# Patient Record
Sex: Male | Born: 1955 | Race: Black or African American | Hispanic: No | Marital: Single | Smoking: Current every day smoker
Health system: Southern US, Community
[De-identification: ages and names within clinical notes are randomized; demographics above are authoritative.]

## PROBLEM LIST (undated history)

## (undated) DIAGNOSIS — R011 Cardiac murmur, unspecified: Secondary | ICD-10-CM

## (undated) DIAGNOSIS — D649 Anemia, unspecified: Secondary | ICD-10-CM

## (undated) DIAGNOSIS — J189 Pneumonia, unspecified organism: Secondary | ICD-10-CM

## (undated) DIAGNOSIS — IMO0002 Reserved for concepts with insufficient information to code with codable children: Secondary | ICD-10-CM

## (undated) DIAGNOSIS — Z8709 Personal history of other diseases of the respiratory system: Secondary | ICD-10-CM

## (undated) DIAGNOSIS — R51 Headache: Secondary | ICD-10-CM

## (undated) DIAGNOSIS — N186 End stage renal disease: Secondary | ICD-10-CM

## (undated) DIAGNOSIS — I313 Pericardial effusion (noninflammatory): Secondary | ICD-10-CM

## (undated) DIAGNOSIS — Z992 Dependence on renal dialysis: Secondary | ICD-10-CM

## (undated) DIAGNOSIS — I729 Aneurysm of unspecified site: Secondary | ICD-10-CM

## (undated) DIAGNOSIS — I10 Essential (primary) hypertension: Secondary | ICD-10-CM

## (undated) DIAGNOSIS — M329 Systemic lupus erythematosus, unspecified: Secondary | ICD-10-CM

## (undated) DIAGNOSIS — I2510 Atherosclerotic heart disease of native coronary artery without angina pectoris: Secondary | ICD-10-CM

## (undated) DIAGNOSIS — K219 Gastro-esophageal reflux disease without esophagitis: Secondary | ICD-10-CM

## (undated) HISTORY — DX: Essential (primary) hypertension: I10

## (undated) HISTORY — DX: Reserved for concepts with insufficient information to code with codable children: IMO0002

## (undated) HISTORY — PX: AV FISTULA PLACEMENT: SHX1204

## (undated) HISTORY — PX: PENILE PROSTHESIS IMPLANT: SHX240

## (undated) HISTORY — DX: End stage renal disease: N18.6

---

## 2008-01-17 ENCOUNTER — Ambulatory Visit: Payer: Self-pay | Admitting: Vascular Surgery

## 2008-01-17 ENCOUNTER — Inpatient Hospital Stay (HOSPITAL_COMMUNITY): Admission: AD | Admit: 2008-01-17 | Discharge: 2008-01-24 | Payer: Self-pay | Admitting: Nephrology

## 2008-01-18 ENCOUNTER — Encounter (INDEPENDENT_AMBULATORY_CARE_PROVIDER_SITE_OTHER): Payer: Self-pay | Admitting: Nephrology

## 2008-03-25 ENCOUNTER — Encounter: Payer: Self-pay | Admitting: Pulmonary Disease

## 2008-04-03 ENCOUNTER — Encounter: Payer: Self-pay | Admitting: Pulmonary Disease

## 2008-06-27 ENCOUNTER — Ambulatory Visit (HOSPITAL_COMMUNITY): Admission: RE | Admit: 2008-06-27 | Discharge: 2008-06-27 | Payer: Self-pay | Admitting: Nephrology

## 2008-10-02 ENCOUNTER — Ambulatory Visit: Payer: Self-pay | Admitting: Vascular Surgery

## 2008-10-21 ENCOUNTER — Ambulatory Visit (HOSPITAL_COMMUNITY): Admission: RE | Admit: 2008-10-21 | Discharge: 2008-10-21 | Payer: Self-pay | Admitting: Vascular Surgery

## 2008-10-21 ENCOUNTER — Ambulatory Visit: Payer: Self-pay | Admitting: Vascular Surgery

## 2008-10-23 ENCOUNTER — Encounter: Payer: Self-pay | Admitting: Pulmonary Disease

## 2009-01-20 ENCOUNTER — Ambulatory Visit: Payer: Self-pay | Admitting: Pulmonary Disease

## 2009-01-20 DIAGNOSIS — E785 Hyperlipidemia, unspecified: Secondary | ICD-10-CM

## 2009-01-20 DIAGNOSIS — M329 Systemic lupus erythematosus, unspecified: Secondary | ICD-10-CM

## 2009-01-20 DIAGNOSIS — IMO0002 Reserved for concepts with insufficient information to code with codable children: Secondary | ICD-10-CM

## 2009-01-20 DIAGNOSIS — J90 Pleural effusion, not elsewhere classified: Secondary | ICD-10-CM | POA: Insufficient documentation

## 2009-01-20 DIAGNOSIS — I10 Essential (primary) hypertension: Secondary | ICD-10-CM

## 2009-07-11 ENCOUNTER — Ambulatory Visit (HOSPITAL_COMMUNITY): Admission: RE | Admit: 2009-07-11 | Discharge: 2009-07-11 | Payer: Self-pay | Admitting: Nephrology

## 2009-10-16 ENCOUNTER — Encounter: Payer: Self-pay | Admitting: Cardiovascular Disease

## 2009-12-12 ENCOUNTER — Ambulatory Visit: Payer: Self-pay | Admitting: Cardiovascular Disease

## 2009-12-12 ENCOUNTER — Encounter: Payer: Self-pay | Admitting: Cardiovascular Disease

## 2009-12-12 ENCOUNTER — Telehealth (INDEPENDENT_AMBULATORY_CARE_PROVIDER_SITE_OTHER): Payer: Self-pay | Admitting: *Deleted

## 2009-12-23 ENCOUNTER — Telehealth: Payer: Self-pay | Admitting: Cardiovascular Disease

## 2009-12-25 ENCOUNTER — Telehealth: Payer: Self-pay | Admitting: Cardiovascular Disease

## 2009-12-26 ENCOUNTER — Telehealth (INDEPENDENT_AMBULATORY_CARE_PROVIDER_SITE_OTHER): Payer: Self-pay | Admitting: *Deleted

## 2009-12-31 ENCOUNTER — Ambulatory Visit: Payer: Self-pay | Admitting: Cardiovascular Disease

## 2009-12-31 ENCOUNTER — Ambulatory Visit (HOSPITAL_COMMUNITY): Admission: RE | Admit: 2009-12-31 | Discharge: 2009-12-31 | Payer: Self-pay | Admitting: Cardiovascular Disease

## 2010-01-05 ENCOUNTER — Ambulatory Visit: Payer: Self-pay | Admitting: Cardiovascular Disease

## 2010-06-09 ENCOUNTER — Emergency Department (HOSPITAL_COMMUNITY): Admission: EM | Admit: 2010-06-09 | Discharge: 2010-06-09 | Payer: Self-pay | Admitting: Emergency Medicine

## 2010-07-26 DIAGNOSIS — I729 Aneurysm of unspecified site: Secondary | ICD-10-CM

## 2010-07-26 HISTORY — DX: Aneurysm of unspecified site: I72.9

## 2010-08-25 NOTE — Progress Notes (Signed)
----   Converted from flag ---- ---- 12/12/2009 1:03 PM, Marilynne Halsted, CMA, AAMA wrote: Pt has Medicaid only.  No precert required.  ---- 12/12/2009 10:09 AM, Dessie Coma wrote: Left Hrt Cath scheduled for Tuesday May 24th with Dx: chest pain Thanks, Victorino Dike ------------------------------

## 2010-08-25 NOTE — Progress Notes (Signed)
----   Converted from flag ---- ---- 12/25/2009 4:07 PM, Marilynne Halsted, CMA, AAMA wrote: OK, pt has Medicaid, no precert required.  ---- 12/25/2009 3:37 PM, Dessie Coma wrote: cardiac cath rescheduled for Wed. 12/31/09 with Dr. Excell Seltzer at [facility]. Thanks, Victorino Dike ------------------------------

## 2010-08-25 NOTE — Progress Notes (Signed)
  Phone Note Outgoing Call   Call placed by: Dessie Coma,  December 25, 2009 3:37 PM Summary of Call: Cardiac cath rescheduled for Wed. 12/31/09 at 10:00 at [facility] with Dr. Hart Robinsons notified while at Dialysis.

## 2010-08-25 NOTE — Progress Notes (Signed)
Summary: reschedule test  Phone Note Call from Patient   Action Taken: Appt Scheduled Today Summary of Call: Pt called and wanted to re-schedule his test that he missed on May 24 in [place].  He tried to re-schedule there but they said he had to call us.  Please schedule for a Friday.  Call him on Tues or Thurs between 10-4pm to let him know appt.  [phone] Initial call taken by: Park Breed,  Dec 23, 2009 3:53 PM     Appended Document: reschedule test Cardiac cath rescheduled for 12/31/09-patient notified.

## 2010-10-06 LAB — BASIC METABOLIC PANEL
CO2: 24 mEq/L (ref 19–32)
Calcium: 8.6 mg/dL (ref 8.4–10.5)
GFR calc Af Amer: 4 mL/min — ABNORMAL LOW (ref >60)
GFR calc non Af Amer: 3 mL/min — ABNORMAL LOW (ref >60)
Glucose, Bld: 104 mg/dL — ABNORMAL HIGH (ref 70–99)
Sodium: 131 mEq/L — ABNORMAL LOW (ref 135–145)

## 2010-10-06 LAB — BASIC METABOLIC PANEL WITH GFR
BUN: 105 mg/dL — ABNORMAL HIGH (ref 6–23)
CO2: 20 meq/L (ref 19–32)
Calcium: 9 mg/dL (ref 8.4–10.5)
Chloride: 95 meq/L — ABNORMAL LOW (ref 96–112)
Creatinine, Ser: 15.84 mg/dL — ABNORMAL HIGH (ref 0.4–1.5)
GFR calc non Af Amer: 3 mL/min — ABNORMAL LOW
Glucose, Bld: 75 mg/dL (ref 70–99)
Potassium: 6.9 meq/L (ref 3.5–5.1)
Sodium: 135 meq/L (ref 135–145)

## 2010-10-06 LAB — CBC
HCT: 41.2 % (ref 39.0–52.0)
Hemoglobin: 13.3 g/dL (ref 13.0–17.0)
MCHC: 32.3 g/dL (ref 30.0–36.0)
RBC: 4.5 MIL/uL (ref 4.22–5.81)

## 2010-10-12 LAB — BASIC METABOLIC PANEL
BUN: 55 mg/dL — ABNORMAL HIGH (ref 6–23)
CO2: 27 mEq/L (ref 19–32)
Chloride: 99 mEq/L (ref 96–112)
Glucose, Bld: 77 mg/dL (ref 70–99)
Potassium: 4.9 mEq/L (ref 3.5–5.1)
Sodium: 141 mEq/L (ref 135–145)

## 2010-10-12 LAB — CBC
HCT: 38.5 % — ABNORMAL LOW (ref 39.0–52.0)
Hemoglobin: 12.4 g/dL — ABNORMAL LOW (ref 13.0–17.0)
MCHC: 32.3 g/dL (ref 30.0–36.0)
MCV: 92 fL (ref 78.0–100.0)
Platelets: 249 10*3/uL (ref 150–400)
RDW: 16.5 % — ABNORMAL HIGH (ref 11.5–15.5)

## 2010-11-05 LAB — POCT I-STAT 4, (NA,K, GLUC, HGB,HCT)
HCT: 46 % (ref 39.0–52.0)
Hemoglobin: 15.6 g/dL (ref 13.0–17.0)
Sodium: 134 mEq/L — ABNORMAL LOW (ref 135–145)

## 2010-11-19 ENCOUNTER — Ambulatory Visit (INDEPENDENT_AMBULATORY_CARE_PROVIDER_SITE_OTHER): Payer: Medicaid Other

## 2010-11-19 DIAGNOSIS — N186 End stage renal disease: Secondary | ICD-10-CM

## 2010-11-19 DIAGNOSIS — T82898A Other specified complication of vascular prosthetic devices, implants and grafts, initial encounter: Secondary | ICD-10-CM

## 2010-11-19 NOTE — H&P (Signed)
HISTORY AND PHYSICAL EXAMINATION  November 19, 2010  Re:  Joel [name], Joel [name]                 DOB:  05/19/1956  CHIEF COMPLAINT:  Large pseudoaneurysm and left upper arm AV fistula.  The patient is a 55 year old gentleman with a history of end-stage renal disease on hemodialysis Tuesday, Thursday and Saturday, diabetes, hypertension who had a left brachiocephalic AV fistula placed by Dr. Edilia Bo in March of 2010.  The fistula had been working extremely well until last Thursday when the proximal portion of the fistula was accessed and post this hemodialysis session he developed a large pseudoaneurysm, which he states has come down somewhat in size. However, the area over the puncture site has become thin.  It was felt that the patient should have resection of this pseudoaneurysm.  The patient denies any signs of steal in the left upper extremity.  PAST MEDICAL HISTORY:  Significant for end-stage renal disease, diabetes, hypertension.  He has no known drug allergies.  MEDICATIONS:  Are amlodipine, lisinopril, Tums, Ultra Renal Vitamins, and Zocor.  SOCIAL HISTORY/FAMILY HISTORY:  Are unchanged from previous hospitalizations.  REVIEW OF SYSTEMS:  Is unchanged except for the development of pseudoaneurysm in the left upper arm AV fistula.  PHYSICAL EXAM:  Is a well-developed, well-nourished gentleman in no acute distress.  He is alert and oriented x3.  HEENT is grossly within normal limits.  His heart rate is 90.  His sats are 100 and his respiratory rate is 12.  His lungs are clear.  His heart rate and rhythm is regular.  Bilateral upper extremities are warm.  He has positive radial pulses.  He has a positive left pseudoaneurysm in the proximal portion of the AV fistula.  Brachiocephalic in the left upper extremity.  ASSESSMENT/PLAN:  Assessment is a new enlarged pseudoaneurysm in the left proximal portion of the AV fistula.  Dr. Darrick Penna was in to see the patient.  He  felt that the patient should have this area resected as there is a very thin layer of skin over the pseudoaneurysm.  It was felt that the patient needed this resection secondary to increased risk of bleeding.  Plan is to have Dr. Imogene Burn do a revision/resection of the pseudoaneurysm with revision of left brachiocephalic AV fistula on Monday 16/04/9603.  Della Goo, PA-C  Charles E. Fields, MD Electronically Signed  RR/MEDQ  D:  11/19/2010  T:  11/19/2010  Job:  [id number]

## 2010-11-23 ENCOUNTER — Ambulatory Visit (HOSPITAL_COMMUNITY)
Admission: RE | Admit: 2010-11-23 | Discharge: 2010-11-23 | Disposition: A | Discharge status: Home / Self Care | Payer: Medicaid Other | Source: Ambulatory Visit | Attending: Vascular Surgery | Admitting: Vascular Surgery

## 2010-11-23 DIAGNOSIS — Z01812 Encounter for preprocedural laboratory examination: Secondary | ICD-10-CM | POA: Insufficient documentation

## 2010-11-23 DIAGNOSIS — Z538 Procedure and treatment not carried out for other reasons: Secondary | ICD-10-CM | POA: Insufficient documentation

## 2010-11-23 LAB — POCT I-STAT 4, (NA,K, GLUC, HGB,HCT)
Glucose, Bld: 88 mg/dL (ref 70–99)
Potassium: 4.8 mEq/L (ref 3.5–5.1)
Sodium: 136 mEq/L (ref 135–145)

## 2010-11-23 LAB — SURGICAL PCR SCREEN: MRSA, PCR: NEGATIVE

## 2010-11-25 ENCOUNTER — Ambulatory Visit (HOSPITAL_COMMUNITY): Payer: Medicaid Other

## 2010-11-25 ENCOUNTER — Ambulatory Visit (HOSPITAL_COMMUNITY)
Admission: RE | Admit: 2010-11-25 | Discharge: 2010-11-25 | Disposition: A | Discharge status: Home / Self Care | Payer: Medicaid Other | Source: Ambulatory Visit | Attending: Vascular Surgery | Admitting: Vascular Surgery

## 2010-11-25 DIAGNOSIS — I2510 Atherosclerotic heart disease of native coronary artery without angina pectoris: Secondary | ICD-10-CM | POA: Insufficient documentation

## 2010-11-25 DIAGNOSIS — Y832 Surgical operation with anastomosis, bypass or graft as the cause of abnormal reaction of the patient, or of later complication, without mention of misadventure at the time of the procedure: Secondary | ICD-10-CM | POA: Insufficient documentation

## 2010-11-25 DIAGNOSIS — T82898A Other specified complication of vascular prosthetic devices, implants and grafts, initial encounter: Secondary | ICD-10-CM | POA: Insufficient documentation

## 2010-11-25 DIAGNOSIS — M329 Systemic lupus erythematosus, unspecified: Secondary | ICD-10-CM | POA: Insufficient documentation

## 2010-11-25 DIAGNOSIS — N186 End stage renal disease: Secondary | ICD-10-CM | POA: Insufficient documentation

## 2010-11-25 DIAGNOSIS — Z992 Dependence on renal dialysis: Secondary | ICD-10-CM | POA: Insufficient documentation

## 2010-11-25 DIAGNOSIS — I721 Aneurysm of artery of upper extremity: Secondary | ICD-10-CM | POA: Insufficient documentation

## 2010-11-25 DIAGNOSIS — F17200 Nicotine dependence, unspecified, uncomplicated: Secondary | ICD-10-CM | POA: Insufficient documentation

## 2010-11-25 DIAGNOSIS — Z01818 Encounter for other preprocedural examination: Secondary | ICD-10-CM | POA: Insufficient documentation

## 2010-11-25 DIAGNOSIS — I120 Hypertensive chronic kidney disease with stage 5 chronic kidney disease or end stage renal disease: Secondary | ICD-10-CM

## 2010-11-25 LAB — POCT I-STAT 4, (NA,K, GLUC, HGB,HCT)
Glucose, Bld: 83 mg/dL (ref 70–99)
HCT: 46 % (ref 39.0–52.0)
Hemoglobin: 15.6 g/dL (ref 13.0–17.0)

## 2010-11-30 NOTE — Op Note (Signed)
Joel [name], Joel [name]               ACCOUNT NO.:  [id number]  MEDICAL RECORD NO.:  [id number]           PATIENT TYPE:  O  LOCATION:  SDSC                         FACILITY:  [facility]  PHYSICIAN:  Dejia Ebron E. Kentravious Lipford, MD  DATE OF BIRTH:  [birth year]  DATE OF PROCEDURE:  11/25/2010 DATE OF DISCHARGE:  11/25/2010                              OPERATIVE REPORT   PROCEDURE:  Revision of left upper arm arteriovenous fistula with 7 mm interposition PTFE graft.  PREOPERATIVE DIAGNOSIS:  Pseudoaneurysm, left upper arm arteriovenous fistula.  POSTOPERATIVE DIAGNOSIS:  Pseudoaneurysm, left upper arm arteriovenous fistula.  ANESTHESIA:  General.  ASSISTANT:  Pecola Leisure, PA-C  OPERATIVE FINDINGS:  A 7-mm interposition graft replacing proximal half of left brachiocephalic AV fistula.  OPERATIVE DETAILS:  After obtaining informed consent, the patient was taken to the operating room.  The patient was placed in supine position on the operating table.  After induction of general anesthesia, the patient's entire left upper extremity was prepped and draped in usual sterile fashion.  Next, a transverse incision was made just above the level of the anastomosis near the antecubital crease.  Incision was carried down through the subcutaneous tissues down the level of the fistula.  The fistula was approximately 8 mm in diameter.  This was dissected free circumferentially.  Vessel loop was placed around this. An additional transverse incision was then made approximately 7 cm more proximal on the arm from this above the area of aneurysmal degeneration. Incision was carried down through the subcutaneous tissues down to level of the fistula, which was of similar diameter.  This was dissected free circumferentially and a vessel loop placed around this.  Next, a subcutaneous tunnel was created and a 7-mm interposition graft was brought through the subcutaneous tunnel lateral to the old fistula.   The patient was then given 5000 units of intravenous heparin.  Fistula was clamped proximally just above the anastomosis and the vein transected at this level.  There was no backbleeding from the vein above after clamping distally.  The 7-mm interposition graft was sewn end-to-end to the proximal fistula using a running 6-0 Prolene suture.  Just prior to the completion of the anastomosis, it was fore bled, back bled, and thoroughly flushed.  Anastomosis was secured.  Clamps were released. There was good pulsatile flow in the graft immediately.  Proximal anastomosis was then packed with thrombin and Gelfoam.  Attention was then turned to the distal limb of the graft.  This was cut to length and anastomosed end-to-end to the distal portion of the fistula using running 6-0 Prolene suture.  Just prior to the completion of anastomosis, it was fore bled, back bled, and thoroughly flushed. Anastomosis was secured.  Clamps were released.  There was good pulsatile flow through the graft immediately.  Hemostasis was obtained with administration of 50 mg of protamine as well as thrombin and Gelfoam.  The old portion of the fistula was fully decompressed from the pseudoaneurysm.  There was no bleeding from the vein on the proximal or distal end.  At this point, everything was thoroughly irrigated with normal saline solution.  Subcutaneous  tissues of both incisions were closed with a running 3-0 Vicryl suture.  Skin of both incisions was closed with a 4-0 Vicryl subcuticular stitch.  The patient tolerated the procedure well and there were no complications.  Instrument, sponge, and needle counts were correct at the end of the case.  The patient was taken to the recovery room in stable condition.     Janetta Hora. Iran Kievit, MD     CEF/MEDQ  D:  11/25/2010  T:  11/26/2010  Job:  [id number]  Electronically Signed by Fabienne Bruns MD on 11/30/2010 07:58:58 AM

## 2010-12-08 NOTE — Assessment & Plan Note (Signed)
[facility]                        [place] CARDIOLOGY OFFICE NOTE   CANYON, LOHR                        MRN:          [id number]  DATE:01/05/2010                            DOB:          03-Jul-1956    PROBLEMS LIST:  1. End-stage renal disease secondary to hypertension.  2. Hypertension.  3. Tobacco use.  4. Secondary hyperparathyroidism.  5. Degenerative disk disease.   INTERVAL HISTORY:  Since the last visit, the patient underwent a left  heart catheterization at [facility] which showed a 30% stenosis in the  proximal LAD, 28-30% stenosis in the left circumflex, and 28-30%  stenosis in the right coronary artery.  The mid right coronary artery  had 50% irregular stenosis that had a peak FFR of 0.92-0.94.  His left  ventricular systolic function was normal.  The patient states that he  has been getting along quite well.  He has been exercising several times  a week and has had no difficulty in doing so.  He avoids fatty foods and  fried foods.   PHYSICAL EXAMINATION:  VITAL SIGNS:  Blood pressure 118/64, pulse 82,  saturating 97% on room air, and he weighs 189 pounds.  GENERAL:  No acute distress.  HEENT:  Normocephalic, atraumatic.  NECK:  Supple.  HEART:  Regular rate and rhythm without murmur, rub, or gallop.  LUNGS:  Clear bilaterally.  ABDOMEN:  Soft, nontender, nondistended.  EXTREMITIES:  Without edema.  Right groin has 2+ femoral pulse and no  evidence of hematoma.   Review of the patient's cholesterol profile, dated Dec 12, 2009, total  cholesterol 200, HDL 87, triglyceride 84, LDL 97.   ASSESSMENT AND PLAN:  The patient is doing well from a cardiovascular  standpoint.  He is not having any symptoms of angina and his heart  catheterization does not show obstructive coronary disease.  His blood  pressure is also very well controlled with diet, exercise, and  lisinopril.  His cholesterol profile is excellent.  At this time, I  will  not initiate statin therapy; however, this could be considered in the  future.  Discussion was held regarding tobacco  cessation.  The patient is making efforts in this direction.  He will  follow up with his primary care physician and we will see him back in 1  year's time unless problem were to arise in the interim.     Brayton El, MD  Electronically Signed    SGA/MedQ  DD: 01/05/2010  DT: 01/06/2010  Job #: [id number]   cc:   Marina Gravel, MD  Garnetta Buddy, M.D.

## 2010-12-08 NOTE — H&P (Signed)
Joel [name], Joel [name] NO.:  [id number]   MEDICAL RECORD NO.:  [id number]          PATIENT TYPE:  INP   LOCATION:  6743                         FACILITY:  [facility]   PHYSICIAN:  Garnetta Buddy, M.D.   DATE OF BIRTH:  11-14-1955   DATE OF ADMISSION:  01/17/2008  DATE OF DISCHARGE:                              HISTORY & PHYSICAL   REASON FOR ADMISSION:  Elevated serum creatinine and congestive heart  failure.   HISTORY OF PRESENTING ILLNESS:  This is a 55 year old African American  male who has a history of hypertension and was evaluated on October 23, 2007, for hypertensive nephrosclerosis.  Serum creatinine at that time  was 6.0.  His GFR was less than 10 mL per minute.  It was recommended  that he be initiated on dialysis and an appointment was made for AV  fistula placement as well as kidney options classes.  Joel [name] did not  comply with this appointments and presented to the emergency room at  [facility] on January 16, 2008, with a serum creatinine of 15.  Prior serological workup with hepatitis B, C, HIV, SPEP, and UPEP was  unremarkable.   His presenting illness included,  1. Dyspnea on exertion.  2. Increasing ankle swelling.  3. Increasing weight of about 30 pounds in the past month.   PAST MEDICAL HISTORY:  1. History of back injury in 2002.  2. History of hypertension.  3. No history of chronic kidney disease, stage V.  4. History of hyperlipidemia, noncompliant with his antilipid agents.   MEDICATIONS:  The patient is no longer on current medications.  His  outpatient medications should include,  1. Amlodipine 5 mg nightly.  2. Atenolol 50 mg daily.  3. Simvastatin 40 mg daily.   ALLERGIES:  No known drug allergies.   REVIEW OF SYSTEMS:  GENERAL:  He denies fatigue, fevers, sweats, or  chills.  EYES:  No visual complaints, blurred vision, or double vision.  EARS, NOSE, MOUTH, AND THROAT:  No hearing loss or epistaxis.  No sore  throat.   Left has dentures.  CARDIOVASCULAR:  No anginal chest pain,  orthopnea, PND, palpitations (pulse); admits to some lower extremity  swelling and dyspnea on exertion.  RESPIRATORY:  He denies cough,  wheeze, or hemoptysis.  ABDOMEN:  No abdominal pain, nausea, vomiting,  or change in bowel habit.  No loss of appetite.  No metallic taste in  mouth.  UROGENITAL:  He denies urgency, frequency, dysuria, or nocturia.  No gross hematuria.  History of bilateral renal calculi on renal  ultrasound.  NEUROLOGIC:  He denies strokes, seizures, numbness,  tingling, dizziness, or weakness.  No dysarthria, dysphagia, or  dysphonia.  MUSCULOSKELETAL:  He denies joint pain or muscle pain.  No  use of nonsteroidal anti-inflammatories.  DERMATOLOGIC:  No skin rashes,  but admits to itching.   SOCIAL HISTORY:  Married.  He does not smoke cigarettes.  He does not  use alcohol.   FAMILY HISTORY:  Negative for end-stage renal disease.   PHYSICAL EXAMINATION:  GENERAL:  Alert, very pleasant gentleman  in no  obvious distress, resting comfortably with 2 L of oxygen via nasal  cannula.  A well-developed, well-nourished [place] American male.  VITAL SIGNS:  Blood pressure 140/90, pulse is 70, and weight is pending.  HEAD AND EYES:  Normocephalic and atraumatic.  Pupils are round, equal,  and reactive.  Grade 2 hypertensive changes.  Funduscopic evaluation has  been benign.  EARS, NOSE, MOUTH, AND THROAT:  Tympanic membranes were normal.  Nasal  mucosa is clear.  Oropharynx is clear.  NECK:  Supple with no thyromegaly and no adenopathy.  No JVP.  Supple.  No carotid bruits.  CARDIOVASCULAR:  No heaves, thrills, rubs, or gallops.  Nondisplaced  apex beat.  Regular rate and rhythm with no murmurs.  RESPIRATORY:  Lung fields are clear to auscultation.  No wheezes or  rales, question of resonance throughout.  ABDOMEN:  Soft and nontender.  Bowel sounds active.  No  organosplenomegaly.  EXTREMITIES:  No cyanosis or  clubbing.  Trace pitting edema.  __________  lateral malleolus.   LABORATORY DATA:  Most recent labs:  WBC 9.5, hemoglobin 7.1, and  platelets of 453.  Albumin 1.9, sodium 138, potassium 3.5, CO2 of 14,  BUN 87, creatinine 15.4, glucose 88, ALT 15, AST 13, alkaline  phosphatase 74, bilirubin less than 0.1, and INR 1.  CPK is negative.   ASSESSMENT AND PLAN:  1. End-stage renal disease, will need initiation on hemodialysis, will      need access placement which includes arteriovenous graft, fistula      as well as Diatek catheter.  2. Anemia.  We will establish posttransfusion 2 units of red blood      cells.  3. Bones.  Continue binders, vitamin D.  We will check PTH.  4. Hypertension, appears adequately controlled.  5. Volume overload.  We will place the patient on Lasix 160 mg q.8 h.  6. Hyperlipidemia.  We will give the patient simvastatin 40 mg daily.      We will check lipid panel.      Garnetta Buddy, M.D.  Electronically Signed     MWW/MEDQ  D:  01/17/2008  T:  01/18/2008  Job:  [id number]

## 2010-12-08 NOTE — Op Note (Signed)
Joel [name], Joel [name]               ACCOUNT NO.:  [id number]   MEDICAL RECORD NO.:  [id number]          PATIENT TYPE:  INP   LOCATION:  6743                         FACILITY:  [facility]   PHYSICIAN:  Di Kindle. Edilia Bo, M.D.DATE OF BIRTH:  1955/11/23   DATE OF PROCEDURE:  01/18/2008  DATE OF DISCHARGE:  01/24/2008                               OPERATIVE REPORT   PREOPERATIVE DIAGNOSIS:  End-stage renal disease   POSTOPERATIVE DIAGNOSIS:  End-stage renal disease.   PROCEDURES:  1. Ultrasound-guided placement of right internal jugular Diatek      catheter.  2. Placement of a right upper arm arteriovenous fistula.   SURGEON:  Di Kindle. Edilia Bo, MD   ASSISTANT:  Nurse.   ANESTHESIA:  Local with sedation.   TECHNIQUE:  The patient was taken to the operating room and sedated by  Anesthesia.  The neck and upper chest were prepped and draped in the  usual sterile fashion.  After the skin was anesthetized with 1%  lidocaine and under ultrasound guidance, the right IJ was cannulated and  a guidewire was introduced into the superior vena cava under  fluoroscopic control.  The tract over the wire was dilated and then the  dilator and peel-away sheath were passed over the wire and then wire and  dilator removed.  The catheter was passed through the peel-away sheath  and positioned in the right atrium.  The exit site for the catheter was  selected and the skin anesthetized between the 2 areas.  The catheter  was then brought through the tunnel, cut to the appropriate length, and  the distal ports were attached.  Both ports withdrew easily.  We then  flushed with heparinized saline and filled with concentrated heparin.  The catheter was secured at its exit site with a 3-0 nylon suture.  The  IJ cannulation site was closed with 4-0 subcuticular stitch.  Sterile  dressing was applied.   Next, attention was turned to placement of a right upper arm AV fistula.  The right upper extremity  was prepped and draped in usual sterile  fashion.  After the skin was infiltrated with 1% lidocaine, a transverse  incision was made above the antecubital level.  Here the cephalic vein  was dissected free and was felt to be of reasonable size.  It was  ligated distally and irrigated up nicely with heparinized saline.  Brachial artery was dissected free beneath the fascia.  The patient was  then heparinized.  The brachial artery was clamped proximally and  distally and a longitudinal arteriotomy was made.  The vein was  mobilized over and sewn end-to-side to the artery using continuous 6-0  Prolene suture.  At the completion, there was a good thrill in the  fistula.  Hemostasis was obtained in the wound and  the wound was closed with deep layer of 3-0 Vicryl and the skin closed  with 4-0 Vicryl.  Sterile dressing was applied.  The patient tolerated  the procedure well and was transferred to the recovery room in  satisfactory condition.  All needle and sponge counts were correct.  Di Kindle. Edilia Bo, M.D.  Electronically Signed     CSD/MEDQ  D:  04/11/2008  T:  04/12/2008  Job:  [id number]

## 2010-12-08 NOTE — Assessment & Plan Note (Signed)
[facility]                        [place] CARDIOLOGY OFFICE NOTE   Joel, [name]                        MRN:          [id number]  DATE:12/12/2009                            DOB:          [birth year]    CHIEF COMPLAINT:  Chest pain.   HISTORY OF PRESENT ILLNESS:  The patient is a 55 year old black male  with past medical history significant for end-stage renal disease  secondary to hypertension on hemodialysis Tuesday, Thursday, Saturday,  hypertension, long-term tobacco use, who is presenting with the chest  discomfort and shortness of breath on exertion.  The patient states that  over the past several months, he has had several episodes of chest  discomfort and shortness of breath every time he has exerted himself  past a moderate degree.  He states he is mainly sedentary.  The chest  discomfort feels like an aching.  It does not radiate, but it is  associated with shortness of breath.  He otherwise has been in his  normal state of health and he states compliance with all of his  medications.  He has never had a cardiac workup in the past.   PAST MEDICAL HISTORY:  As above in HPI.  In addition, the patient has  degenerative disk disease, secondary hyperparathyroidism.   SOCIAL HISTORY:  Half pack a day for approximately 25 years, no alcohol.  No stimulant drug use.   FAMILY HISTORY:  Negative for premature coronary artery disease.   ALLERGIES:  No known drug allergies.   MEDICATIONS:  1. Lisinopril 40 mg daily.  2. PhosLo Nephro-Vite.   REVIEW OF SYSTEMS:  As in HPI.  In addition, the patient endorses lower  back pain.  Other systems as in HPI are otherwise negative.   PHYSICAL EXAMINATION:  VITAL SIGNS:  Blood pressure 120/76, heart rate  78, satting 97% on room air, and he weighs 180 pounds.  GENERAL:  No acute distress.  HEENT:  Normocephalic, atraumatic.  NECK:  Supple.  HEART:  Regular rate and rhythm without murmur, rub, or  gallop.  LUNGS:  Clear bilaterally.  ABDOMEN:  Soft, nontender, nondistended.  EXTREMITIES:  Without edema.  SKIN:  Warm and dry.  PSYCHIATRIC:  The patient is appropriate.  NEUROLOGIC:  Nonfocal.  MUSCULOSKELETAL:  Bilateral upper and lower extremity strength 5/5.   Review of the patient's EKG taken today in clinic normal sinus rhythm,  left anterior fascicular block, Q-waves in leads V1 through V3 with  approximately 1-2 mm of ST-segment elevation in leads V1 through V4.  There are nonspecific T-wave changes elsewhere compared with EKG dated  March 07, 2009.  There has been no significant change.  Review of lab  work from March indicates a BMP within normal limits with exception of a  potassium 5.2, creatinine 11.7.  His white count is 7.  His hemoglobin  is 11, hematocrit 35.  No platelet count was reported.   ASSESSMENT:  A 55 year old black male with multiple risk factors for  coronary artery disease presenting with chest discomfort and shortness  of breath with exertion.  He also has a EKG  indicating probable prior  myocardial infarction dating back to at least August 2010.  The patient  also is a ongoing tobacco user and today he expresses interest in  tobacco cessation.   PLAN:  We will have the patient institute therapy with aspirin 81 mg  daily.  We will set him up for a left heart catheterization next week to  define his coronary anatomy and rule out obstructive coronary artery  disease.  We will see him back in clinic after the heart  catheterization.  At that time, we will discuss possible interventions  for tobacco cessation.  Today, we will check a CMP, CBC, troponin, and  fasting lipids.  The heart catheterization was performed in the morning  of his dialysis.  The patient is aware of the risks and benefits of the  procedure and he agrees to proceed.  He states that he is now completely  compliant with his medications and would have no issues taking long-term   antiplatelet agents.     Brayton El, MD  Electronically Signed    SGA/MedQ  DD: 12/12/2009  DT: 12/13/2009  Job #: [id number]

## 2010-12-08 NOTE — Op Note (Signed)
Joel [name], Joel [name]               ACCOUNT NO.:  [id number]   MEDICAL RECORD NO.:  [id number]          PATIENT TYPE:  AMB   LOCATION:  SDS                          FACILITY:  [facility]   PHYSICIAN:  Di Kindle. Edilia Bo, M.D.DATE OF BIRTH:  14-Feb-1956   DATE OF PROCEDURE:  10/21/2008  DATE OF DISCHARGE:  10/21/2008                               OPERATIVE REPORT   PREOPERATIVE DIAGNOSIS:  Stage V chronic kidney disease.   POSTOPERATIVE DIAGNOSIS:  Stage V chronic kidney disease.   PROCEDURE:  Placement of a new left upper arm arteriovenous fistula.   SURGEON:  Di Kindle. Edilia Bo, MD   ASSISTANT:  Jerold Coombe, PA   ANESTHESIA:  Local with sedation.   TECHNIQUE:  The patient was taken to the operating room, sedated by  Anesthesia.  The left upper extremity was prepped and draped in the  usual sterile fashion.  After the skin was infiltrated with 1%  lidocaine, a transverse incision was made just above the antecubital  level.  The cephalic vein was dissected free, was ligated distally, and  irrigated up nicely with heparinized saline.  The brachial artery was  dissected free beneath the fascia.  The patient was heparinized.  The  brachial artery was clamped proximally and distally and a longitudinal  arteriotomy was made.  The vein was mobilized over and sent end-to-side  to the artery using continuous 6-0 Prolene suture.  At the completion,  there was an excellent thrill in the fistula and a palpable radial  pulse.  Hemostasis was obtained of the wound.  The wound was closed the  deep layer with 3-0 Vicryl, subcutaneous layer with 4-0 Vicryl.  Sterile  dressing was applied.  The patient tolerated the procedure well and was  transferred to the recovery room in satisfactory condition.  All needle  and sponge counts were correct.      Di Kindle. Edilia Bo, M.D.  Electronically Signed     CSD/MEDQ  D:  10/21/2008  T:  10/22/2008  Job:  [id number]   cc:   [facility]

## 2010-12-08 NOTE — Assessment & Plan Note (Signed)
OFFICE VISIT   Joel [name], Joel [name]  DOB:  January 06, 1956                                       10/02/2008  ZOXWR#:[id number]   The patient is a 55 year old male with end-stage renal disease who  currently dialyzes on Tuesdays, Thursdays and Saturdays at [facility].  He is referred by Dr. Hyman Hopes for placement of hemodialysis  access.  He is left-handed.  He currently is dialyzing via a right  brachiocephalic AV fistula which was placed some while back.  Apparently  the flow rate on dialysis has been diminished and recent shuntogram  shows degeneration of the right upper arm AV fistula and there is  concern that it may have eminent failure.  He is referred today for  placement of a new hemodialysis access.   His medications include amlodipine, fosinopril, Tums Ultra, renal  vitamin and Zocor.   He has no known drug allergies.   Past medical history is also significant for hypertension, iron  deficiency anemia, dyslipidemia, secondary hyperparathyroidism, medical  noncompliance, left pleural effusion.   On physical exam blood pressure is 127/78 in the left arm, pulse is 92  and regular.  Right upper extremity shows a brachiocephalic AV fistula  with a thrill proximally.  There is evidence of degeneration of the  central portion of the fistula and the flow is not as easily palpable in  this area.  Left upper extremity shows diminished left and right radial  pulse, left brachial pulse is 2+.   He had a vein mapping ultrasound today which showed the cephalic vein in  the left upper extremity is between 31-40 mm in the forearm and 33-53 mm  in the upper arm.   Since the patient's right upper arm fistula is failing he will need to  have a new access placed.  Hopefully a new access will be ready by the  time the right arm access fails.  I believe the best option for him  would be placement of a left brachiocephalic AV fistula since the left  radial pulse  is fairly diminished.  I discussed with him today the  risks, benefits, possible complications and procedure details including  but not limited to nonmaturation of the fistula 10-15%, bleeding,  infection.  He understands and agrees to proceed.  He also understands  there is a small risk of developing ischemic steal.  Fistula placement  is scheduled for 10/21/2008 and this was at the patient's request.   Janetta Hora. Fields, MD  Electronically Signed   CEF/MEDQ  D:  10/02/2008  T:  10/03/2008  Job:  [id number]   cc:   Garnetta Buddy, M.D.

## 2010-12-08 NOTE — Procedures (Signed)
CEPHALIC VEIN MAPPING   INDICATION:  End-stage renal disease.   HISTORY:  End-stage renal disease.   EXAM:   The right cephalic vein is not evaluated.   The left cephalic vein is compressible.   Diameter measurements range from 0.31 to 0.33.   See attached worksheet for all measurements.   IMPRESSION:  Patent left cephalic vein which is of acceptable diameter  for use as a dialysis access site.   ___________________________________________  Janetta Hora. Fields, MD   MG/MEDQ  D:  10/02/2008  T:  10/02/2008  Job:  [id number]

## 2010-12-08 NOTE — Letter (Signed)
Dec 12, 2009    Joel [name]. Caryn Section, M.D.  [facility]  [address]  [place], [place]  [address]   RE:  Joel [name], Joel [name]  MRN:  [id number]  /  DOB:  21-Mar-1956   Dear Dr. Caryn Section:   I had the pleasure of seeing Joel [name] in clinic this morning.  As you  know, he is a 55 year old gentleman with end-stage renal disease from  hypertension who has been having chest discomfort and shortness of  breath with exertion.  His EKG indicates a prior myocardial infarction  in the anterior septal distribution.  This EKG is unchanged from August  2010.  We will proceed with a left heart catheterization and possible  percutaneous intervention as the patient's symptoms are concerning for  obstructive coronary disease.  This will be arranged on a day prior to  hemodialysis.  I have asked him to institute therapy with aspirin 81 mg  daily.   Thank you for the referral of this patient and I look forward to  following him along with you.    Sincerely,      Brayton El, MD  Electronically Signed    SGA/MedQ  DD: 12/12/2009  DT: 12/12/2009  Job #: [id number]

## 2010-12-11 NOTE — Discharge Summary (Signed)
NAMECAMILA, NORVILLE NO.:  [id number]   MEDICAL RECORD NO.:  [id number]          PATIENT TYPE:  INP   LOCATION:  6743                         FACILITY:  [facility]   PHYSICIAN:  Garnetta Buddy, M.D.   DATE OF BIRTH:  [birth year]   DATE OF ADMISSION:  01/17/2008  DATE OF DISCHARGE:  01/24/2008                               DISCHARGE SUMMARY   DISCHARGE DIAGNOSES:  1. New end-stage renal disease with congestive heart failure and      uremia on admission.  2. Hypertension.  3. Anemia.  4. Secondary hypoparathyroidism.  5. Left upper lobe pneumonia/improved.   PROCEDURES:  1. On January 18, 2008, placement of right internal jugular permanent      catheter and right AVF by Dr. Waverly Ferrari.  2. On January 18, 2005, CT angiography of the chest.  Impression, dense      left upper lobe pneumonia with associated reactive adenopathy.  No      pulmonary emboli.  Small bilateral pleural effusions and small      pericardial effusion.  Mild bilateral lower lobe atelectasis.   PROCEDURE:  Hemodialysis.   CONSULTATIONS:  Di Kindle. Edilia Bo, MD   HISTORY OF PRESENT ILLNESS:  Mr. Gatt is a 55 year old African  American gentleman with a past medical history significant for  hypertension that was a recently evaluated on October 23, 2007, for  hypertensive nephrosclerosis by Dr. Elvis Coil.  Serum creatinine at  that time was 6.  GFR was less than 10 mL/min.  It was recommended that  the patient be initiated on hemodialysis.  An appointment was made for  an AV fistula as well as kidney option class.  The patient did not  comply with his appointments and presented to the emergency room at  [facility] on January 16, 2008, with a serum creatinine of 15.  Prior serological workup with hepatitis B and C, HIV, SPEP and UPEP were  unremarkable.   ADMISSION LABORATORY DATA:  WBC is 9.5, hemoglobin 7.1, and platelets  453.  Albumin 1.9, sodium 138, potassium 3.5, CO2 of 14, BUN  87,  creatinine 15.4, and glucose 88.  CPK negative.   HOSPITAL COURSE:  1. New end-stage renal disease with congestive heart failure and      uremia on admission.  The patient was admitted for initiation of      hemodialysis.  The patient was provided appropriate education and      video on hemodialysis, because he did not attend the Kidney options      class.  We consulted the [facility] for access to be placed to include a      right internal jugular and right AVF, which was placed on January 18, 2008, after appropriate vein mapping.  The patient initiated      hemodialysis on January 18, 2008, with a successful treatment and a      net UF of 2000.  He then dialyzed again on January 19, 2008; January 20, 2008; January 22, 2008; January 23, 2008, and prior  to discharge.  The      patient handled hemodialysis well.  We were able to UF      approximately 12 L during this admission.  The patient was accepted      at the [facility] in which he will begin once he is      discharged.  The patient's uremia symptoms did resolve prior to      discharge.  2. Hypertension.  The patient came to the hospital with no medication.      He had not been on medications in quite some time.  Initially,      Lasix was started at 160 mg every 8 hours until hemodialysis could      be initiated.  Lasix was then discontinued and amlodipine was      initiated, and blood pressures ranged in the 150-160 systolic.      Amlodipine was decreased to rule out for appropriate      ultrafiltration on hemodialysis.  Throughout this admission, the      patient's blood pressures did improve to a more normotensive range.  3. Anemia.  This was felt as a chronic disease secondary to the      patient's underlying renal insufficiency.  The patient was began on      Aranesp per protocol.  Iron studies were also performed with iron      stores being less than 10, therefore IV iron was initiated      appropriately this admission and  will be continued at the      outpatient dialysis center.  4. Secondary hyperparathyroidism.  Of note, the patient's parathyroid      harmone was 247.3, calcium 6.7 and phosphorus at 5.3, phosphate      binders as well as vitamin D was initiated during this      hospitalization, and calcium and phosphorus were monitored      respectively.  5. Left upper lobe pneumonia, improved.  On January 19, 2008, the patient      began experiencing pleuritic chest pain and noted to be febrile      with a T-max of 101.  CT angio was performed because of the      pleuritic chest pain and ruled out for pulmonary emboli, but it was      positive for left upper lobe pneumonia.  The patient was initially      placed on vancomycin empirically for fevers, but once CT positive      for pneumonia converted to Avelox.  The patient was afebrile, on      the date of discharge, feeling better.  H1N1 was negative this      admission.  Of note, the patient was placed on respiratory droplet      precautions in anticipation of H1N2.   DISCHARGE MEDICATIONS:  1. Zocor 40 mg daily.  2. Nephro-Vite daily.  3. Tums Ultra 3 tablets with all meals.  4. Norvasc 5 mg at bedtime.  5. Avelox 400 p.o. daily, times an additional 6 days.   DISCHARGE DIAGNOSES:  The patient is restricted to 40 ounces of fluid  per day.  He will be instructed on a renal diet from the dietician at  the [facility]; however, he is to monitor his salt and  potassium intake.  He will initiate hemodialysis at the [facility] on Thursday at 10:30.  He will increase his activity slowly.  He  will avoid any  showers while the permanent catheter is in.      Azucena Fallen, [place]      Garnetta Buddy, M.D.  Electronically Signed    MY/MEDQ  D:  03/04/2008  T:  03/05/2008  Job:  [id number]   cc:   [facility]  [address]

## 2010-12-24 ENCOUNTER — Encounter: Payer: Self-pay | Admitting: Cardiovascular Disease

## 2010-12-28 ENCOUNTER — Ambulatory Visit: Payer: Self-pay | Admitting: Cardiovascular Disease

## 2011-04-22 LAB — HEMOGLOBIN A1C
Hgb A1c MFr Bld: 5.1
Mean Plasma Glucose: 104

## 2011-04-22 LAB — CBC
HCT: 23.3 — ABNORMAL LOW
HCT: 28.9 — ABNORMAL LOW
Hemoglobin: 7.8 — CL
Hemoglobin: 9.6 — ABNORMAL LOW
MCHC: 34.7
MCV: 87.6
Platelets: 369
Platelets: 440 — ABNORMAL HIGH
RBC: 2.7 — ABNORMAL LOW
RBC: 3.14 — ABNORMAL LOW
RBC: 3.3 — ABNORMAL LOW
RDW: 14.3
RDW: 14.7
RDW: 15.4
WBC: 13.2 — ABNORMAL HIGH
WBC: 16.5 — ABNORMAL HIGH

## 2011-04-22 LAB — RENAL FUNCTION PANEL
Albumin: 1.3 — ABNORMAL LOW
CO2: 11 — ABNORMAL LOW
CO2: 22
CO2: 27
Calcium: 7.2 — ABNORMAL LOW
Calcium: 7.7 — ABNORMAL LOW
Calcium: 7.8 — ABNORMAL LOW
Chloride: 113 — ABNORMAL HIGH
Chloride: 99
Creatinine, Ser: 8.85 — ABNORMAL HIGH
GFR calc Af Amer: 4 — ABNORMAL LOW
GFR calc Af Amer: 4 — ABNORMAL LOW
GFR calc Af Amer: 5 — ABNORMAL LOW
GFR calc Af Amer: 8 — ABNORMAL LOW
GFR calc non Af Amer: 3 — ABNORMAL LOW
GFR calc non Af Amer: 3 — ABNORMAL LOW
GFR calc non Af Amer: 4 — ABNORMAL LOW
GFR calc non Af Amer: 6 — ABNORMAL LOW
Glucose, Bld: 113 — ABNORMAL HIGH
Glucose, Bld: 90
Glucose, Bld: 92
Phosphorus: 11.5 — ABNORMAL HIGH
Phosphorus: 5.3 — ABNORMAL HIGH
Potassium: 3.3 — ABNORMAL LOW
Potassium: 3.5
Potassium: 3.8
Sodium: 134 — ABNORMAL LOW
Sodium: 140
Sodium: 141

## 2011-04-22 LAB — CROSSMATCH
ABO/RH(D): B POS
Antibody Screen: NEGATIVE

## 2011-04-22 LAB — URINALYSIS, ROUTINE W REFLEX MICROSCOPIC
Bilirubin Urine: NEGATIVE
Glucose, UA: 100 — AB
Protein, ur: >300 — AB
Specific Gravity, Urine: 1.025
Urobilinogen, UA: 0.2

## 2011-04-22 LAB — URINE MICROSCOPIC-ADD ON

## 2011-04-22 LAB — APTT: aPTT: 33

## 2011-04-22 LAB — PROTIME-INR: INR: 1.1

## 2011-04-22 LAB — PTH, INTACT AND CALCIUM
Calcium, Total (PTH): 6.7 — ABNORMAL LOW
PTH: 247.3 — ABNORMAL HIGH

## 2011-04-22 LAB — IRON AND TIBC
Iron: <10 — ABNORMAL LOW
UIBC: 107
UIBC: 83

## 2011-04-22 LAB — CULTURE, BLOOD (ROUTINE X 2): Culture: NO GROWTH

## 2011-04-22 LAB — BASIC METABOLIC PANEL
BUN: 93 — ABNORMAL HIGH
CO2: 14 — ABNORMAL LOW
Calcium: 7 — ABNORMAL LOW
Creatinine, Ser: 15.14 — ABNORMAL HIGH
GFR calc Af Amer: 4 — ABNORMAL LOW

## 2011-04-22 LAB — TSH: TSH: 1.691

## 2011-04-22 LAB — FERRITIN: Ferritin: 988 — ABNORMAL HIGH (ref 22–322)

## 2011-04-22 LAB — LIPID PANEL
Cholesterol: 258 — ABNORMAL HIGH
LDL Cholesterol: 183 — ABNORMAL HIGH
Total CHOL/HDL Ratio: 6.3

## 2011-04-22 LAB — H1N1 SCREEN (PCR): H1N1 Virus Scrn: NOT DETECTED

## 2011-04-30 LAB — RENAL FUNCTION PANEL
Albumin: 2.3 g/dL — ABNORMAL LOW (ref 3.5–5.2)
CO2: 22 mEq/L (ref 19–32)
Chloride: 98 mEq/L (ref 96–112)
GFR calc Af Amer: 3 mL/min — ABNORMAL LOW (ref >60)
GFR calc non Af Amer: 3 mL/min — ABNORMAL LOW (ref >60)
Potassium: 5.2 mEq/L — ABNORMAL HIGH (ref 3.5–5.1)
Sodium: 135 mEq/L (ref 135–145)

## 2011-04-30 LAB — HEPATITIS B SURFACE ANTIGEN: Hepatitis B Surface Ag: NEGATIVE

## 2011-06-01 ENCOUNTER — Encounter: Payer: Self-pay | Admitting: Cardiovascular Disease

## 2011-07-27 DIAGNOSIS — I309 Acute pericarditis, unspecified: Secondary | ICD-10-CM | POA: Insufficient documentation

## 2011-08-17 ENCOUNTER — Inpatient Hospital Stay (HOSPITAL_COMMUNITY)
Admission: EM | Admit: 2011-08-17 | Discharge: 2011-08-21 | DRG: 248 | Disposition: A | Discharge status: Home / Self Care | Payer: Medicaid Other | Source: Ambulatory Visit | Attending: Internal Medicine | Admitting: Internal Medicine

## 2011-08-17 ENCOUNTER — Encounter (HOSPITAL_COMMUNITY): Payer: Self-pay | Admitting: *Deleted

## 2011-08-17 ENCOUNTER — Emergency Department (HOSPITAL_COMMUNITY): Payer: Medicaid Other

## 2011-08-17 ENCOUNTER — Other Ambulatory Visit: Payer: Self-pay

## 2011-08-17 DIAGNOSIS — M329 Systemic lupus erythematosus, unspecified: Secondary | ICD-10-CM | POA: Diagnosis present

## 2011-08-17 DIAGNOSIS — Z7982 Long term (current) use of aspirin: Secondary | ICD-10-CM

## 2011-08-17 DIAGNOSIS — Z79899 Other long term (current) drug therapy: Secondary | ICD-10-CM

## 2011-08-17 DIAGNOSIS — I10 Essential (primary) hypertension: Secondary | ICD-10-CM | POA: Diagnosis present

## 2011-08-17 DIAGNOSIS — I120 Hypertensive chronic kidney disease with stage 5 chronic kidney disease or end stage renal disease: Secondary | ICD-10-CM | POA: Diagnosis present

## 2011-08-17 DIAGNOSIS — D631 Anemia in chronic kidney disease: Secondary | ICD-10-CM | POA: Diagnosis present

## 2011-08-17 DIAGNOSIS — IMO0002 Reserved for concepts with insufficient information to code with codable children: Secondary | ICD-10-CM | POA: Diagnosis present

## 2011-08-17 DIAGNOSIS — I313 Pericardial effusion (noninflammatory): Secondary | ICD-10-CM

## 2011-08-17 DIAGNOSIS — I2510 Atherosclerotic heart disease of native coronary artery without angina pectoris: Secondary | ICD-10-CM | POA: Diagnosis present

## 2011-08-17 DIAGNOSIS — R931 Abnormal findings on diagnostic imaging of heart and coronary circulation: Secondary | ICD-10-CM | POA: Diagnosis present

## 2011-08-17 DIAGNOSIS — Z992 Dependence on renal dialysis: Secondary | ICD-10-CM

## 2011-08-17 DIAGNOSIS — J810 Acute pulmonary edema: Secondary | ICD-10-CM | POA: Diagnosis present

## 2011-08-17 DIAGNOSIS — N186 End stage renal disease: Secondary | ICD-10-CM | POA: Diagnosis present

## 2011-08-17 DIAGNOSIS — I319 Disease of pericardium, unspecified: Secondary | ICD-10-CM

## 2011-08-17 DIAGNOSIS — Z87891 Personal history of nicotine dependence: Secondary | ICD-10-CM

## 2011-08-17 DIAGNOSIS — E8770 Fluid overload, unspecified: Secondary | ICD-10-CM | POA: Diagnosis present

## 2011-08-17 DIAGNOSIS — I501 Left ventricular failure: Principal | ICD-10-CM | POA: Diagnosis present

## 2011-08-17 DIAGNOSIS — N039 Chronic nephritic syndrome with unspecified morphologic changes: Secondary | ICD-10-CM | POA: Diagnosis present

## 2011-08-17 DIAGNOSIS — R9389 Abnormal findings on diagnostic imaging of other specified body structures: Secondary | ICD-10-CM

## 2011-08-17 DIAGNOSIS — Z7902 Long term (current) use of antithrombotics/antiplatelets: Secondary | ICD-10-CM

## 2011-08-17 HISTORY — DX: Headache: R51

## 2011-08-17 HISTORY — DX: Pneumonia, unspecified organism: J18.9

## 2011-08-17 HISTORY — DX: Atherosclerotic heart disease of native coronary artery without angina pectoris: I25.10

## 2011-08-17 LAB — BASIC METABOLIC PANEL
Calcium: 9.5 mg/dL (ref 8.4–10.5)
GFR calc Af Amer: 4 mL/min — ABNORMAL LOW (ref >90)
GFR calc non Af Amer: 4 mL/min — ABNORMAL LOW (ref >90)
Potassium: 4.9 mEq/L (ref 3.5–5.1)
Sodium: 142 mEq/L (ref 135–145)

## 2011-08-17 LAB — POCT I-STAT, CHEM 8
Creatinine, Ser: 11.1 mg/dL — ABNORMAL HIGH (ref 0.50–1.35)
Glucose, Bld: 74 mg/dL (ref 70–99)
HCT: 37 % — ABNORMAL LOW (ref 39.0–52.0)
Hemoglobin: 12.6 g/dL — ABNORMAL LOW (ref 13.0–17.0)
Potassium: 4.8 mEq/L (ref 3.5–5.1)
TCO2: 33 mmol/L (ref 0–100)

## 2011-08-17 LAB — CBC
HCT: 37.2 % — ABNORMAL LOW (ref 39.0–52.0)
Hemoglobin: 11.3 g/dL — ABNORMAL LOW (ref 13.0–17.0)
Hemoglobin: 12.4 g/dL — ABNORMAL LOW (ref 13.0–17.0)
MCHC: 32 g/dL (ref 30.0–36.0)
MCV: 85.3 fL (ref 78.0–100.0)
Platelets: 213 10*3/uL (ref 150–400)
RBC: 4.02 MIL/uL — ABNORMAL LOW (ref 4.22–5.81)
RDW: 14.6 % (ref 11.5–15.5)
WBC: 5.3 10*3/uL (ref 4.0–10.5)

## 2011-08-17 LAB — DIFFERENTIAL
Basophils Relative: 1 % (ref 0–1)
Eosinophils Absolute: 0.3 10*3/uL (ref 0.0–0.7)
Lymphs Abs: 1.8 10*3/uL (ref 0.7–4.0)
Monocytes Relative: 6 % (ref 3–12)
Neutro Abs: 3.6 10*3/uL (ref 1.7–7.7)
Neutrophils Relative %: 60 % (ref 43–77)

## 2011-08-17 LAB — PROTIME-INR
INR: 1.02 (ref 0.00–1.49)
Prothrombin Time: 13.6 seconds (ref 11.6–15.2)

## 2011-08-17 LAB — CARDIAC PANEL(CRET KIN+CKTOT+MB+TROPI)
CK, MB: 4.1 ng/mL — ABNORMAL HIGH (ref 0.3–4.0)
Total CK: 111 U/L (ref 7–232)
Troponin I: <0.3 ng/mL (ref <0.30)

## 2011-08-17 LAB — MRSA PCR SCREENING: MRSA by PCR: NEGATIVE

## 2011-08-17 LAB — CREATININE, SERUM: GFR calc Af Amer: 8 mL/min — ABNORMAL LOW (ref >90)

## 2011-08-17 LAB — APTT: aPTT: 31 seconds (ref 24–37)

## 2011-08-17 MED ORDER — PARICALCITOL 5 MCG/ML IV SOLN
3.0000 ug | INTRAVENOUS | Status: DC
  Administered 2011-08-17: 3 ug via INTRAVENOUS

## 2011-08-17 MED ORDER — SODIUM CHLORIDE 0.9 % IV SOLN
250.0000 mL | INTRAVENOUS | Status: DC | PRN
  Administered 2011-08-18: 250 mL via INTRAVENOUS

## 2011-08-17 MED ORDER — ASPIRIN 81 MG PO CHEW
324.0000 mg | CHEWABLE_TABLET | Freq: Once | ORAL | Status: AC
  Administered 2011-08-17: 324 mg via ORAL
  Filled 2011-08-17: qty 4

## 2011-08-17 MED ORDER — NA FERRIC GLUC CPLX IN SUCROSE 12.5 MG/ML IV SOLN
62.5000 mg | INTRAVENOUS | Status: DC
  Administered 2011-08-19: 62.5 mg via INTRAVENOUS
  Filled 2011-08-17 (×2): qty 5

## 2011-08-17 MED ORDER — SODIUM CHLORIDE 0.9 % IJ SOLN
3.0000 mL | Freq: Two times a day (BID) | INTRAMUSCULAR | Status: DC
  Administered 2011-08-17 – 2011-08-20 (×4): 3 mL via INTRAVENOUS

## 2011-08-17 MED ORDER — HEPARIN SODIUM (PORCINE) 5000 UNIT/ML IJ SOLN
5000.0000 [IU] | Freq: Three times a day (TID) | INTRAMUSCULAR | Status: DC
  Administered 2011-08-17 – 2011-08-18 (×3): 5000 [IU] via SUBCUTANEOUS
  Filled 2011-08-17 (×6): qty 1

## 2011-08-17 MED ORDER — LISINOPRIL 40 MG PO TABS
40.0000 mg | ORAL_TABLET | Freq: Every day | ORAL | Status: DC
  Administered 2011-08-17 – 2011-08-21 (×5): 40 mg via ORAL
  Filled 2011-08-17 (×5): qty 1

## 2011-08-17 MED ORDER — PARICALCITOL 5 MCG/ML IV SOLN
INTRAVENOUS | Status: AC
  Filled 2011-08-17: qty 1

## 2011-08-17 MED ORDER — LORAZEPAM 2 MG/ML IJ SOLN
1.0000 mg | Freq: Once | INTRAMUSCULAR | Status: AC
  Administered 2011-08-17: 1 mg via INTRAVENOUS

## 2011-08-17 MED ORDER — SODIUM CHLORIDE 0.9 % IJ SOLN
3.0000 mL | Freq: Two times a day (BID) | INTRAMUSCULAR | Status: DC
  Administered 2011-08-17 – 2011-08-20 (×5): 3 mL via INTRAVENOUS

## 2011-08-17 MED ORDER — PANTOPRAZOLE SODIUM 40 MG PO TBEC
40.0000 mg | DELAYED_RELEASE_TABLET | Freq: Every day | ORAL | Status: DC
  Administered 2011-08-17 – 2011-08-20 (×3): 40 mg via ORAL
  Filled 2011-08-17 (×3): qty 1

## 2011-08-17 MED ORDER — POLYETHYLENE GLYCOL 3350 17 G PO PACK
17.0000 g | PACK | Freq: Every day | ORAL | Status: DC | PRN
  Filled 2011-08-17: qty 1

## 2011-08-17 MED ORDER — RENA-VITE PO TABS
1.0000 | ORAL_TABLET | Freq: Every day | ORAL | Status: DC
  Administered 2011-08-17 – 2011-08-20 (×4): 1 via ORAL
  Filled 2011-08-17 (×5): qty 1

## 2011-08-17 MED ORDER — FOSINOPRIL SODIUM 20 MG PO TABS: 40.0000 mg | ORAL_TABLET | Freq: Every day | ORAL | Status: DC

## 2011-08-17 MED ORDER — SODIUM CHLORIDE 0.9 % IV SOLN
Freq: Once | INTRAVENOUS | Status: AC
Start: 2011-08-17 — End: 2011-08-17
  Administered 2011-08-17: 04:00:00 via INTRAVENOUS

## 2011-08-17 MED ORDER — CALCIUM ACETATE 667 MG PO CAPS
1334.0000 mg | ORAL_CAPSULE | Freq: Three times a day (TID) | ORAL | Status: DC
  Administered 2011-08-17: 1334 mg via ORAL
  Filled 2011-08-17 (×3): qty 2

## 2011-08-17 MED ORDER — TRAMADOL-ACETAMINOPHEN 37.5-325 MG PO TABS
1.0000 | ORAL_TABLET | Freq: Four times a day (QID) | ORAL | Status: DC | PRN
  Filled 2011-08-17: qty 1

## 2011-08-17 MED ORDER — SODIUM CHLORIDE 0.9 % IJ SOLN: 3.0000 mL | INTRAMUSCULAR | Status: DC | PRN

## 2011-08-17 MED ORDER — LANTHANUM CARBONATE 500 MG PO CHEW
1000.0000 mg | CHEWABLE_TABLET | Freq: Two times a day (BID) | ORAL | Status: DC
  Administered 2011-08-19 – 2011-08-20 (×3): 1000 mg via ORAL
  Filled 2011-08-17 (×11): qty 2

## 2011-08-17 MED ORDER — ASPIRIN 81 MG PO TABS: 81.0000 mg | ORAL_TABLET | Freq: Every day | ORAL | Status: DC

## 2011-08-17 MED ORDER — ACETAMINOPHEN 650 MG RE SUPP: 650.0000 mg | Freq: Four times a day (QID) | RECTAL | Status: DC | PRN

## 2011-08-17 MED ORDER — NITROGLYCERIN IN D5W 200-5 MCG/ML-% IV SOLN: 5.0000 ug/min | Freq: Once | INTRAVENOUS | Status: DC

## 2011-08-17 MED ORDER — LANTHANUM CARBONATE 500 MG PO CHEW
500.0000 mg | CHEWABLE_TABLET | Freq: Three times a day (TID) | ORAL | Status: DC
  Administered 2011-08-17: 500 mg via ORAL
  Filled 2011-08-17 (×3): qty 1

## 2011-08-17 MED ORDER — ASPIRIN 81 MG PO CHEW
81.0000 mg | CHEWABLE_TABLET | Freq: Every day | ORAL | Status: DC
  Administered 2011-08-17: 81 mg via ORAL
  Filled 2011-08-17: qty 1
  Filled 2011-08-17: qty 4

## 2011-08-17 MED ORDER — SODIUM CHLORIDE 0.9 % IV SOLN: 250.0000 mL | INTRAVENOUS | Status: DC | PRN

## 2011-08-17 MED ORDER — ZOLPIDEM TARTRATE 5 MG PO TABS: 5.0000 mg | ORAL_TABLET | Freq: Every evening | ORAL | Status: DC | PRN

## 2011-08-17 MED ORDER — CALCIUM ACETATE 667 MG PO CAPS
2001.0000 mg | ORAL_CAPSULE | Freq: Three times a day (TID) | ORAL | Status: DC
  Administered 2011-08-17 – 2011-08-21 (×7): 2001 mg via ORAL
  Filled 2011-08-17 (×16): qty 3

## 2011-08-17 MED ORDER — PARICALCITOL 5 MCG/ML IV SOLN
3.0000 ug | INTRAVENOUS | Status: DC
  Administered 2011-08-19 – 2011-08-21 (×2): 3 ug via INTRAVENOUS
  Filled 2011-08-17 (×2): qty 0.6

## 2011-08-17 MED ORDER — HEPARIN BOLUS VIA INFUSION
4000.0000 [IU] | Freq: Once | INTRAVENOUS | Status: DC
  Filled 2011-08-17: qty 4000

## 2011-08-17 MED ORDER — NITROGLYCERIN IN D5W 200-5 MCG/ML-% IV SOLN
INTRAVENOUS | Status: AC
  Filled 2011-08-17: qty 250

## 2011-08-17 MED ORDER — ACETAMINOPHEN 325 MG PO TABS
650.0000 mg | ORAL_TABLET | Freq: Four times a day (QID) | ORAL | Status: DC | PRN
  Administered 2011-08-18: 650 mg via ORAL
  Filled 2011-08-17 (×2): qty 2

## 2011-08-17 MED ORDER — LORAZEPAM 2 MG/ML IJ SOLN
INTRAMUSCULAR | Status: AC
Start: 2011-08-17 — End: 2011-08-17
  Administered 2011-08-17: 1 mg via INTRAVENOUS
  Filled 2011-08-17: qty 1

## 2011-08-17 MED ORDER — HYDROCODONE-ACETAMINOPHEN 5-325 MG PO TABS
1.0000 | ORAL_TABLET | ORAL | Status: DC | PRN
  Administered 2011-08-17: 2 via ORAL
  Filled 2011-08-17: qty 2

## 2011-08-17 MED ORDER — LANTHANUM CARBONATE 500 MG PO CHEW: 1000.0000 mg | CHEWABLE_TABLET | Freq: Two times a day (BID) | ORAL | Status: DC

## 2011-08-17 MED ORDER — ASPIRIN 81 MG PO CHEW
324.0000 mg | CHEWABLE_TABLET | ORAL | Status: AC
  Administered 2011-08-18: 324 mg via ORAL

## 2011-08-17 NOTE — Consult Note (Signed)
Reason for Consult:  Pulmonary edema, need for HD Referring Physician: [name] is an 56 y.o. male.  HPI: PMhx of really only HTN as well as ESRD, on for about 4-5 years with minimal problems.  He notes a 10 day history of worsening SOB, orthopnea and PND as well as worsening BP control.  His EDW was lowered 0.5 kg but still early this AM became SOB requiring call to 911, ER where he had malignant HTN and pulmonary edema requiring bipap  Dialyzes at [facility] on [facility]  [employer]. EDW recently dec to 87. HD [facility] 2.5/2.0,, Heparin yes, 10, 000 units. Access AVF.  Past Medical History  Diagnosis Date  . ESRD (end stage renal disease)   . HTN (hypertension)   . Hyperparathyroidism   . DDD (degenerative disc disease)     Past Surgical History  Procedure Date  . Av fistula placement     History reviewed. No pertinent family history.  Social History:  reports that he quit smoking 7 days ago. He does not have any smokeless tobacco history on file. He reports that he uses illicit drugs. He reports that he does not drink alcohol.  Allergies:  Allergies  Allergen Reactions  . Aspirin     REACTION: unable to take d/t kidney dx    Medications: I have reviewed the patient's current medications. Zemplar 3,  Epogen 0  Venofer 50 weekly  Results for orders placed during the hospital encounter of 08/17/11 (from the past 48 hour(s))  BASIC METABOLIC PANEL     Status: Abnormal   Collection Time   08/17/11  2:48 AM      Component Value Range Comment   Sodium 142  135 - 145 (mEq/L)    Potassium 4.9  3.5 - 5.1 (mEq/L)    Chloride 95 (*) 96 - 112 (mEq/L)    CO2 32  19 - 32 (mEq/L)    Glucose, Bld 78  70 - 99 (mg/dL)    BUN 74 (*) 6 - 23 (mg/dL)    Creatinine, Ser 16.10 (*) 0.50 - 1.35 (mg/dL)    Calcium 9.5  8.4 - 10.5 (mg/dL)    GFR calc non Af Amer 4 (*) >90 (mL/min)    GFR calc Af Amer 4 (*) >90 (mL/min)   CBC     Status: Abnormal   Collection Time   08/17/11  2:48 AM      Component Value Range Comment   WBC 6.1  4.0 - 10.5 (K/uL)    RBC 4.02 (*) 4.22 - 5.81 (MIL/uL)    Hemoglobin 11.3 (*) 13.0 - 17.0 (g/dL)    HCT 96.0 (*) 45.4 - 52.0 (%)    MCV 87.8  78.0 - 100.0 (fL)    MCH 28.1  26.0 - 34.0 (pg)    MCHC 32.0  30.0 - 36.0 (g/dL)    RDW 09.8  11.9 - 14.7 (%)    Platelets 213  150 - 400 (K/uL)   DIFFERENTIAL     Status: Normal   Collection Time   08/17/11  2:48 AM      Component Value Range Comment   Neutrophils Relative 60  43 - 77 (%)    Neutro Abs 3.6  1.7 - 7.7 (K/uL)    Lymphocytes Relative 29  12 - 46 (%)    Lymphs Abs 1.8  0.7 - 4.0 (K/uL)    Monocytes Relative 6  3 - 12 (%)    Monocytes Absolute 0.3  0.1 - 1.0 (K/uL)    Eosinophils Relative 5  0 - 5 (%)    Eosinophils Absolute 0.3  0.0 - 0.7 (K/uL)    Basophils Relative 1  0 - 1 (%)    Basophils Absolute 0.0  0.0 - 0.1 (K/uL)   APTT     Status: Normal   Collection Time   08/17/11  2:48 AM      Component Value Range Comment   aPTT 31  24 - 37 (seconds)   PROTIME-INR     Status: Normal   Collection Time   08/17/11  2:48 AM      Component Value Range Comment   Prothrombin Time 13.6  11.6 - 15.2 (seconds)    INR 1.02  0.00 - 1.49    PRO B NATRIURETIC PEPTIDE     Status: Abnormal   Collection Time   08/17/11  2:49 AM      Component Value Range Comment   Pro B Natriuretic peptide (BNP) >70000.0 (*) 0 - 125 (pg/mL)   POCT I-STAT TROPONIN I     Status: Normal   Collection Time   08/17/11  2:59 AM      Component Value Range Comment   Troponin i, poc 0.07  0.00 - 0.08 (ng/mL)    Comment 3            POCT I-STAT, CHEM 8     Status: Abnormal   Collection Time   08/17/11  3:01 AM      Component Value Range Comment   Sodium 142  135 - 145 (mEq/L)    Potassium 4.8  3.5 - 5.1 (mEq/L)    Chloride 102  96 - 112 (mEq/L)    BUN 75 (*) 6 - 23 (mg/dL)    Creatinine, Ser 16.10 (*) 0.50 - 1.35 (mg/dL)    Glucose, Bld 74  70 - 99 (mg/dL)    Calcium, Ion 9.60  1.12 - 1.32  (mmol/L)    TCO2 33  0 - 100 (mmol/L)    Hemoglobin 12.6 (*) 13.0 - 17.0 (g/dL)    HCT 45.4 (*) 09.8 - 52.0 (%)     Dg Chest Port 1 View  08/17/2011  *RADIOLOGY REPORT*  Clinical Data: Shortness of breath and chest pain.  PORTABLE CHEST - 1 VIEW  Comparison: Chest radiograph performed 11/25/2010  Findings: The lungs are well-aerated.  New vascular congestion is noted, with bilateral central airspace opacities, left greater than right.  This may reflect asymmetric pulmonary edema or possibly pneumonia.  There is a persistent chronic small left pleural effusion.  No pneumothorax is seen.  The cardiomediastinal silhouette is borderline normal in size; calcification is noted within the aortic arch.  No acute osseous abnormalities are seen.  IMPRESSION: New vascular congestion, with bilateral central airspace opacities, left greater than right.  This may reflect asymmetric pulmonary edema or possibly pneumonia.  Chronic small left pleural effusion again seen.  Original Report Authenticated By: Tonia Ghent, M.D.    ROS: positive for SOB, DOE, orthopnea and PND as well as CP when having these other symptoms.  Denies F/C/cough, no N/V/D/C.  Has lost weight, can tell in his clothes.  Otherwise ROS is negative  Blood pressure 159/100, pulse 83, temperature 97.2 F (36.2 C), temperature source Oral, resp. rate 16, SpO2 94.00%. General appearance: alert, cooperative and mild distress Neck: no adenopathy, no carotid bruit, no JVD, supple, symmetrical, trachea midline and thyroid not enlarged, symmetric, no tenderness/mass/nodules Resp: diminished breath sounds bilaterally  Cardio: normal apical impulse, systolic murmur: holosystolic 2/6, blowing at 2nd left intercostal space and no rub GI: soft, non-tender; bowel sounds normal; no masses,  no organomegaly Extremities: extremities normal, atraumatic, no cyanosis or edema L AVF, accessed  Assessment/Plan: 1 Pulmonary edema/malignant HTN- emergent HD to  bring volume and BP down.  Wean NTG drip as able during treatment.  Attempting 5-6 liters of UF.  Initial set of cardiac enzymes is negative, suspect chest discomfort is just from volume overload.  Patient will have new lower EDW at time of D/C 2 ESRD: Today is normal day, done semiemergently as above, will need to reassess in AM if further off schedule HD is needed 3 Hypertension: malignant due to above, not normally on any BP meds 4. Anemia of ESRD: controlled, no current ESA needed, will continue venofer 5. Metabolic Bone Disease: says on Fosrenol and phoslo as well as zemplar, will continue home doses 6. Dispo- hopefully will not need long inpatient stay   Joel [name] A 08/17/2011, 7:25 AM

## 2011-08-17 NOTE — ED Notes (Signed)
Per EMS, Pt c/o substernal non-radiating chest pressure, intermittent over the last week. Pt rated pain 8/10 on EMS arrival, after o2 and aspirin pt is pain free. Pt is tues, thurs, sat dialysis pt.

## 2011-08-17 NOTE — H&P (Signed)
Hospital Admission Note Date: 08/17/2011  PCP: Zada Girt, MD, MD  Chief Complaint: orthopnea  History of Present Illness: 56 year old male with past medical history of end-stage renal disease for 5 years with HD Tuesday Thursdays and Saturdays that comes in for 5 day history of shortness of breath and orthopnea. He related a started about 6 days ago wasn't dyspnea while laying down. In the last few days he has been sleeping up. And called EMS today because he couldn't tolerate this. Indeed it he was found to have an elevated blood pressure190/111 and was started on nitro drip x-ray done that showed pulmonary edema and he require BiPAP. After started on nitroglycerin. He was taken off BiPAP and his breathing got better.  I'm seeing the patient right now while he's on dialysis. He relates his shortness of breath is much improved. He does relate he has not been compliant with his fluid restriction. SGOT very labile the last few weeks.  She relates no chest pain or palpitation dizziness nausea vomiting or diarrhea. Allergies:  Aspirin Past Medical History  Diagnosis Date  . ESRD (end stage renal disease)   . HTN (hypertension)   . Hyperparathyroidism   . DDD (degenerative disc disease)    Prior to Admission medications   Medication Sig Start Date End Date Taking? Authorizing Provider  calcium acetate (PHOSLO) 667 MG capsule Take 2,001 mg by mouth 3 (three) times daily with meals.     Yes Historical Provider, MD  lanthanum (FOSRENOL) 1000 MG chewable tablet Chew 1,000 mg by mouth 2 (two) times daily with a meal.   Yes Historical Provider, MD  omeprazole (PRILOSEC) 40 MG capsule Take 40 mg by mouth daily.   Yes Historical Provider, MD  traMADol-acetaminophen (ULTRACET) 37.5-325 MG per tablet Take 1 tablet by mouth every 6 (six) hours as needed. For pain   Yes Historical Provider, MD   Past Surgical History  Procedure Date  . Av fistula placement    History reviewed. No pertinent family  history. History   Social History  . Marital Status: Legally Separated    Spouse Name: N/A    Number of Children: N/A  . Years of Education: N/A   Occupational History  . disabled    Social History Main Topics  . Smoking status: Former Smoker -- 0.3 packs/day for 25 years    Quit date: 08/10/2011  . Smokeless tobacco: Not on file  . Alcohol Use: No  . Drug Use: Yes  . Sexually Active: Not on file   Other Topics Concern  . Not on file   Social History Narrative  . No narrative on file    REVIEW OF SYSTEMS:  Constitutional:  No weight loss, night sweats, Fevers, chills, fatigue.  HEENT:  No headaches, Difficulty swallowing,Tooth/dental problems,Sore throat,  No sneezing, itching, ear ache, nasal congestion, post nasal drip,  Cardio-vascular:  No chest pain,  swelling in lower extremities, anasarca, dizziness, palpitations  GI:  No heartburn, indigestion, abdominal pain, nausea, vomiting, diarrhea, change in bowel habits, loss of appetite  Resp:  No shortness of breath with exertion or at rest. No excess mucus, no productive cough, No non-productive cough, No coughing up of blood.No change in color of mucus.No wheezing.No chest wall deformity  Skin:  no rash or lesions.  GU:  no dysuria, change in color of urine, no urgency or frequency. No flank pain.  Musculoskeletal:  No joint pain or swelling. No decreased range of motion. No back pain.  Psych:  No  change in mood or affect. No depression or anxiety. No memory loss.   Physical Exam: Filed Vitals:   08/17/11 0930 08/17/11 1000 08/17/11 1030 08/17/11 1104  BP: 169/72 147/70 154/85 156/90  Pulse: 96 89 95 83  Temp:    97.2 F (36.2 C)  TempSrc:      Resp: 14 12 28 19   SpO2: 99% 97% 99% 98%    Intake/Output Summary (Last 24 hours) at 08/17/11 1119 Last data filed at 08/17/11 1104  Gross per 24 hour  Intake      0 ml  Output   5250 ml  Net  -5250 ml   BP 156/90  Pulse 83  Temp(Src) 97.2 F (36.2 C)  (Oral)  Resp 19  SpO2 98%  General Appearance:    Alert, cooperative, no distress, appears stated age  Head:    Normocephalic, without obvious abnormality, atraumatic  Eyes:    PERRL, conjunctiva/corneas clear, EOM's intact, fundi    benign, both eyes       Ears:    Normal TM's and external ear canals, both ears  Nose:   Nares normal, septum midline, mucosa normal, no drainage    or sinus tenderness  Throat:   Lips, mucosa, and tongue normal; teeth and gums normal  Neck:   Supple, symmetrical, trachea midline, no adenopathy;       thyroid:  No enlargement/tenderness/nodules; no carotid   Bruit. Positive JVD  Back:     Symmetric, no curvature, ROM normal, no CVA tenderness  Lungs:     Good air movemnt crackles b/l at bases.  Chest wall:    No tenderness or deformity  Heart:    Regular rate and rhythm, S1 and S2 normal, no murmur, rub   or gallop  Abdomen:     Soft, non-tender, bowel sounds active all four quadrants,    no masses, no organomegaly        Extremities:   Extremities normal, atraumatic, no cyanosis, minimal edema  Pulses:   2+ and symmetric all extremities  Skin:   Skin color, texture, turgor normal, no rashes or lesions  Lymph nodes:   Cervical, supraclavicular, and axillary nodes normal  Neurologic:   CNII-XII intact. Normal strength, sensation and reflexes      throughout   Lab results:  [facility] 08/17/11 0301 08/17/11 0248  NA 142 142  K 4.8 4.9  CL 102 95*  CO2 -- 32  GLUCOSE 74 78  BUN 75* 74*  CREATININE 11.10* 12.91*  CALCIUM -- 9.5  MG -- --  PHOS -- --    Basename 08/17/11 0301 08/17/11 0248  WBC -- 6.1  NEUTROABS -- 3.6  HGB 12.6* 11.3*  HCT 37.0* 35.3*  MCV -- 87.8  PLT -- 213    Imaging results:  Dg Chest Port 1 View  08/17/2011  *RADIOLOGY REPORT*  Clinical Data: Shortness of breath and chest pain.  PORTABLE CHEST - 1 VIEW  Comparison: Chest radiograph performed 11/25/2010  Findings: The lungs are well-aerated.  New vascular congestion  is noted, with bilateral central airspace opacities, left greater than right.  This may reflect asymmetric pulmonary edema or possibly pneumonia.  There is a persistent chronic small left pleural effusion.  No pneumothorax is seen.  The cardiomediastinal silhouette is borderline normal in size; calcification is noted within the aortic arch.  No acute osseous abnormalities are seen.  IMPRESSION: New vascular congestion, with bilateral central airspace opacities, left greater than right.  This may reflect asymmetric pulmonary  edema or possibly pneumonia.  Chronic small left pleural effusion again seen.  Original Report Authenticated By: Tonia Ghent, M.D.   Other results: EKG: none at this time.   Patient Active Hospital Problem List: -Pulmonary edema, acute (08/17/2011) -is most likely secondary to not being compliant with his fluid restriction. Nitroglycerin can be titrated off. Once he is BP >180/100  At this time he is in no acute distress, off BiPAP and able to speak in full sentences. Is currently getting dialysis. I spoken with a nephrologist who thinks he might need another day of dialysis. I also agree with a nephrologist to get a 2-D echo to evaluate his cardiac function. Cardiology was consulted they recommended to cycle one more set of cardiac enzymes and if negative no further workups.   -HYPERTENSION (01/20/2009) Malignant hypertension, most likely multifactorial secondary to volume overload but also to respiratory distress.once he was started on nitroglycerin his blood pressure got much improved. With dialysis she this should continue.we'll consider restarting his oral meds after dialysis.   -ESRD: Per renal.  Anemia of chronic disease: Per renal.  Calcium and phosphorus: -per renal. DEGENERATIVE DISC DISEASE (01/20/2009) Continue home meds no changes were made.   Marinda Elk M.D. [facility] [phone] 08/17/2011, 11:19 AM

## 2011-08-17 NOTE — Progress Notes (Signed)
  Echocardiogram 2D Echocardiogram has been performed.  Cathie Beams Deneen 08/17/2011, 3:18 PM

## 2011-08-17 NOTE — ED Notes (Signed)
Called iv nurse for iv assist. Unable to get iv

## 2011-08-17 NOTE — Consult Note (Signed)
Reason for Consult:Abnormal EKG Referring Physician: Dr Shelton Silvas Joel [name] is an 56 y.o. malewith ESRD on dialysis, non obstructive CAD, HTN HPI: Patient is an ESRD on dialysis who has been developing a gradual onset of shortness of breath for about a week now. There is also associated inability to lay flat and now PND. He denies leg edema. He states he had no missed any dialysis session and he normally gets dialyzed on Tuesdays Thursdays and saturdays. Of note his ice machine broke down recently so he has been drinking more water than usual. He used to suck on ice in the past to reduce his thirst. He denies chest pain, palpitations, lightheadedness. No diaphoresis, no nausea or vomiting.   Past Medical History  Diagnosis Date  . ESRD (end stage renal disease)   . HTN (hypertension)   . Hyperparathyroidism   . DDD (degenerative disc disease)     Past Surgical History  Procedure Date  . Av fistula placement     History reviewed. No pertinent family history.  Social History:  reports that he quit smoking 7 days ago. He does not have any smokeless tobacco history on file. He reports that he uses illicit drugs. He reports that he does not drink alcohol.  Allergies:  Allergies  Allergen Reactions  . Aspirin     REACTION: unable to take d/t kidney dx    Medications: I have reviewed the patient's current medications.  Results for orders placed during the hospital encounter of 08/17/11 (from the past 48 hour(s))  BASIC METABOLIC PANEL     Status: Abnormal   Collection Time   08/17/11  2:48 AM      Component Value Range Comment   Sodium 142  135 - 145 (mEq/L)    Potassium 4.9  3.5 - 5.1 (mEq/L)    Chloride 95 (*) 96 - 112 (mEq/L)    CO2 32  19 - 32 (mEq/L)    Glucose, Bld 78  70 - 99 (mg/dL)    BUN 74 (*) 6 - 23 (mg/dL)    Creatinine, Ser 16.10 (*) 0.50 - 1.35 (mg/dL)    Calcium 9.5  8.4 - 10.5 (mg/dL)    GFR calc non Af Amer 4 (*) >90 (mL/min)    GFR calc Af Amer 4 (*) >90  (mL/min)   CBC     Status: Abnormal   Collection Time   08/17/11  2:48 AM      Component Value Range Comment   WBC 6.1  4.0 - 10.5 (K/uL)    RBC 4.02 (*) 4.22 - 5.81 (MIL/uL)    Hemoglobin 11.3 (*) 13.0 - 17.0 (g/dL)    HCT 96.0 (*) 45.4 - 52.0 (%)    MCV 87.8  78.0 - 100.0 (fL)    MCH 28.1  26.0 - 34.0 (pg)    MCHC 32.0  30.0 - 36.0 (g/dL)    RDW 09.8  11.9 - 14.7 (%)    Platelets 213  150 - 400 (K/uL)   DIFFERENTIAL     Status: Normal   Collection Time   08/17/11  2:48 AM      Component Value Range Comment   Neutrophils Relative 60  43 - 77 (%)    Neutro Abs 3.6  1.7 - 7.7 (K/uL)    Lymphocytes Relative 29  12 - 46 (%)    Lymphs Abs 1.8  0.7 - 4.0 (K/uL)    Monocytes Relative 6  3 - 12 (%)    Monocytes  Absolute 0.3  0.1 - 1.0 (K/uL)    Eosinophils Relative 5  0 - 5 (%)    Eosinophils Absolute 0.3  0.0 - 0.7 (K/uL)    Basophils Relative 1  0 - 1 (%)    Basophils Absolute 0.0  0.0 - 0.1 (K/uL)   APTT     Status: Normal   Collection Time   08/17/11  2:48 AM      Component Value Range Comment   aPTT 31  24 - 37 (seconds)   PROTIME-INR     Status: Normal   Collection Time   08/17/11  2:48 AM      Component Value Range Comment   Prothrombin Time 13.6  11.6 - 15.2 (seconds)    INR 1.02  0.00 - 1.49    PRO B NATRIURETIC PEPTIDE     Status: Abnormal   Collection Time   08/17/11  2:49 AM      Component Value Range Comment   Pro B Natriuretic peptide (BNP) >70000.0 (*) 0 - 125 (pg/mL)   POCT I-STAT TROPONIN I     Status: Normal   Collection Time   08/17/11  2:59 AM      Component Value Range Comment   Troponin i, poc 0.07  0.00 - 0.08 (ng/mL)    Comment 3            POCT I-STAT, CHEM 8     Status: Abnormal   Collection Time   08/17/11  3:01 AM      Component Value Range Comment   Sodium 142  135 - 145 (mEq/L)    Potassium 4.8  3.5 - 5.1 (mEq/L)    Chloride 102  96 - 112 (mEq/L)    BUN 75 (*) 6 - 23 (mg/dL)    Creatinine, Ser 46.96 (*) 0.50 - 1.35 (mg/dL)    Glucose, Bld 74   70 - 99 (mg/dL)    Calcium, Ion 2.95  1.12 - 1.32 (mmol/L)    TCO2 33  0 - 100 (mmol/L)    Hemoglobin 12.6 (*) 13.0 - 17.0 (g/dL)    HCT 28.4 (*) 13.2 - 52.0 (%)     Dg Chest Port 1 View  08/17/2011  *RADIOLOGY REPORT*  Clinical Data: Shortness of breath and chest pain.  PORTABLE CHEST - 1 VIEW  Comparison: Chest radiograph performed 11/25/2010  Findings: The lungs are well-aerated.  New vascular congestion is noted, with bilateral central airspace opacities, left greater than right.  This may reflect asymmetric pulmonary edema or possibly pneumonia.  There is a persistent chronic small left pleural effusion.  No pneumothorax is seen.  The cardiomediastinal silhouette is borderline normal in size; calcification is noted within the aortic arch.  No acute osseous abnormalities are seen.  IMPRESSION: New vascular congestion, with bilateral central airspace opacities, left greater than right.  This may reflect asymmetric pulmonary edema or possibly pneumonia.  Chronic small left pleural effusion again seen.  Original Report Authenticated By: Tonia Ghent, M.D.    Review of Systems  Constitutional: Negative.   HENT: Negative.   Eyes: Negative.   Respiratory: Positive for shortness of breath. Negative for cough, hemoptysis, sputum production and wheezing.   Cardiovascular: Positive for orthopnea and PND. Negative for chest pain and leg swelling.  Gastrointestinal: Positive for heartburn. Negative for nausea and abdominal pain.  Genitourinary:       ERSD on dialysis  Musculoskeletal: Negative.   Skin: Negative.   Neurological: Negative.   Endo/Heme/Allergies: Negative.  Psychiatric/Behavioral: Negative.    Blood pressure 167/103, pulse 85, temperature 98.3 F (36.8 C), temperature source Oral, resp. rate 20, SpO2 99.00%. Physical Exam  Constitutional: He is oriented to person, place, and time. He appears distressed.  HENT:  Mouth/Throat: No oropharyngeal exudate.  Eyes: No scleral icterus.   Neck: JVD present.  Cardiovascular: Regular rhythm, S1 normal and S2 normal.  Tachycardia present.  PMI is not displaced.  Exam reveals no gallop, no S3, no S4, no distant heart sounds and no friction rub.   Murmur heard.  Systolic murmur is present with a grade of 2/6  Respiratory: Breath sounds normal. He is in respiratory distress. He has no wheezes. He has no rales. He exhibits no tenderness.  GI: Soft. He exhibits no distension. There is no tenderness.  Musculoskeletal: Normal range of motion. He exhibits no edema and no tenderness.  Neurological: He is alert and oriented to person, place, and time.  Skin: Skin is warm and dry. No rash noted. He is not diaphoretic. No erythema. No pallor.  Psychiatric: He has a normal mood and affect. His behavior is normal. Judgment and thought content normal.   EKG Sinus rhythm. The only change compared to previous EKG is t wave inversion in additional leads (II, V5).T wave inversion has been noted in the past in leads I,aVL and V6 No ST segment depression or elevation.  LHC  (12/2009) 1. Left mainstem:  Widely patent.  The left main divides into the LAD,     left circumflex, and intermediate branch. 2. LAD:  The LAD has minor plaque.  The proximal LAD has 30% stenosis.     The mid LAD has scattered irregularities.  First diagonal is widely     patent. 3. Ramus intermedius:  The intermediate branch is patent.  There is no     obstructive disease.  The intermediate to large vessel divides into     3 branch vessels. 4. Left circumflex:  The left circumflex is patent and there is minor     plaque in the mid circumflex of 20% to 30%.  It supplies 2 small OM     branches. 5. RCA:  The RCA has diffuse plaquing in the proximal and mid vessel.     The proximal RCA has 20% to 30% stenosis, the mid RCA has 50%     irregular stenosis.  The distal vessel gives off a PDA and     posterolateral branch, both of which are widely patent. 6. Left  ventriculography shows normal LV function.  There is no     significant mitral regurgitation present.  The EF is estimated at     60% to 65%. 7. Pressure wire analysis:  The peak FFR on the right coronary artery     was in the range of 0.92-0.94.  FINAL ASSESSMENT: 1. Nonobstructive coronary artery disease as described. 2. Negative pressure wire analysis of the right coronary artery. 3. Normal left ventricular function.   Assessment New T wave inversion Fluid overload in patient with ESRD secondary to increased intake Severe HTN  RECOMMENDATIONS New T wave inversion is highly non specific. Would recommend renal consult for urgent dialysis If 2 sets of troponin 12 hrs apart are negative, will probably stop drawing same. Continue ASA Will probably need statins if no contraindication given non obstructive CAD. Nitro drip for severe HTN Cardiology is signing off. Please re-consult PRN. Thanks  Joel [name] 08/17/2011, 5:54 AM

## 2011-08-17 NOTE — Progress Notes (Signed)
Report called to Ander Purpura RN.

## 2011-08-17 NOTE — Significant Event (Signed)
Dr Briant Cedar notified of findings- see echo report. Study was done post hemodialysis. I am concerned about effusive constrictive physiology. Consider right heart catheterization ( diagnostic purposes )  before next dialysis session with minimal fluid removal  Alvin Critchley [facility] 08/17/2011 4:52 PM

## 2011-08-17 NOTE — ED Provider Notes (Addendum)
History     CSN: [id number]  Arrival date & time 08/17/11  0220   First MD Initiated Contact with Patient 08/17/11 [phone]      Chief Complaint  Patient presents with  . Chest Pain    (Consider location/radiation/quality/duration/timing/severity/associated sxs/prior treatment) HPI Comments: Patient is a 56 year old male with a history of end-stage renal disease on houses for the last 4 years, hypertension.  He states that over the last 3 days he has developed gradual onset of shortness of breath at night when he lays down. This orthopnea has been persistent, improved when he sits upright, but overall is worsening over the last 3 days.  It is associated with some exertional chest pain which resolves with rest. He admits to seeing his cardiologist several months ago and verbalizes that he had a stress test which she states was normal. He denies any history of coronary disease otherwise. At this time his symptoms are mild, he does feel a pressure in his chest which is worse this evening.  EMS gave the patient aspirin and oxygen and on arrival he states that he is pain-free. He is supposed to get dialysis this morning. He has not missed his dialysis on Saturday.  He denies fevers, chills, cough, abdominal pain, back pain, swelling, diarrhea, headache or sore throat. He does not make urine  Patient is a 56 y.o. male presenting with chest pain. The history is provided by the patient, medical records and the EMS personnel.  Chest Pain     Past Medical History  Diagnosis Date  . ESRD (end stage renal disease)   . HTN (hypertension)   . Hyperparathyroidism   . DDD (degenerative disc disease)     Past Surgical History  Procedure Date  . Av fistula placement     History reviewed. No pertinent family history.  History  Substance Use Topics  . Smoking status: Former Smoker -- 0.3 packs/day for 25 years    Quit date: 08/10/2011  . Smokeless tobacco: Not on file  . Alcohol Use: No       Review of Systems  Cardiovascular: Positive for chest pain.  All other systems reviewed and are negative.    Allergies  Aspirin  Home Medications   Current Outpatient Rx  Name Route Sig Dispense Refill  . CALCIUM ACETATE 667 MG PO CAPS Oral Take 2,001 mg by mouth 3 (three) times daily with meals.      Marland Kitchen LANTHANUM CARBONATE 1000 MG PO CHEW Oral Chew 1,000 mg by mouth 2 (two) times daily with a meal.    . OMEPRAZOLE 40 MG PO CPDR Oral Take 40 mg by mouth daily.    . TRAMADOL-ACETAMINOPHEN 37.5-325 MG PO TABS Oral Take 1 tablet by mouth every 6 (six) hours as needed. For pain      BP 179/100  Temp(Src) 98.5 F (36.9 C) (Oral)  Resp 16  SpO2 97%  Physical Exam  Nursing note and vitals reviewed. Constitutional: He appears well-developed and well-nourished. No distress.  HENT:  Head: Normocephalic and atraumatic.  Mouth/Throat: Oropharynx is clear and moist. No oropharyngeal exudate.  Eyes: Conjunctivae and EOM are normal. Pupils are equal, round, and reactive to light. Right eye exhibits no discharge. Left eye exhibits no discharge. No scleral icterus.  Neck: Normal range of motion. Neck supple. No JVD present. No thyromegaly present.  Cardiovascular: Normal rate, regular rhythm, normal heart sounds and intact distal pulses.  Exam reveals no gallop and no friction rub.   No murmur heard.  Pulmonary/Chest: Effort normal and breath sounds normal. No respiratory distress. He has no wheezes. He has no rales.  Abdominal: Soft. Bowel sounds are normal. He exhibits no distension and no mass. There is no tenderness.  Musculoskeletal: Normal range of motion. He exhibits no edema and no tenderness.  Lymphadenopathy:    He has no cervical adenopathy.  Neurological: He is alert. Coordination normal.  Skin: Skin is warm and dry. No rash noted. No erythema.  Psychiatric: He has a normal mood and affect. His behavior is normal.    ED Course  Procedures (including critical care  time)  Labs Reviewed  BASIC METABOLIC PANEL - Abnormal; Notable for the following:    Chloride 95 (*)    BUN 74 (*)    Creatinine, Ser 12.91 (*)    GFR calc non Af Amer 4 (*)    GFR calc Af Amer 4 (*)    All other components within normal limits  CBC - Abnormal; Notable for the following:    RBC 4.02 (*)    Hemoglobin 11.3 (*)    HCT 35.3 (*)    All other components within normal limits  PRO B NATRIURETIC PEPTIDE - Abnormal; Notable for the following:    Pro B Natriuretic peptide (BNP) >70000.0 (*)    All other components within normal limits  POCT I-STAT, CHEM 8 - Abnormal; Notable for the following:    BUN 75 (*)    Creatinine, Ser 11.10 (*)    Hemoglobin 12.6 (*)    HCT 37.0 (*)    All other components within normal limits  DIFFERENTIAL  APTT  PROTIME-INR  POCT I-STAT TROPONIN I  I-STAT TROPONIN I  I-STAT, CHEM 8   Dg Chest Port 1 View  08/17/2011  *RADIOLOGY REPORT*  Clinical Data: Shortness of breath and chest pain.  PORTABLE CHEST - 1 VIEW  Comparison: Chest radiograph performed 11/25/2010  Findings: The lungs are well-aerated.  New vascular congestion is noted, with bilateral central airspace opacities, left greater than right.  This may reflect asymmetric pulmonary edema or possibly pneumonia.  There is a persistent chronic small left pleural effusion.  No pneumothorax is seen.  The cardiomediastinal silhouette is borderline normal in size; calcification is noted within the aortic arch.  No acute osseous abnormalities are seen.  IMPRESSION: New vascular congestion, with bilateral central airspace opacities, left greater than right.  This may reflect asymmetric pulmonary edema or possibly pneumonia.  Chronic small left pleural effusion again seen.  Original Report Authenticated By: Tonia Ghent, M.D.     1. Chest pain       MDM  There is no pulmonary edema on exam his oxygen levels are 100% without oxygen supplementation and he has no peripheral edema. His cardiac  exam shows PVCs, no murmurs rubs or gallops. EKG has flipped T waves in leads V5 and V6, leads 2 V1 lead L. He does have deep Q waves in V1 2 and 3 all of which findings are consistent with November 11 EKG other than T-wave inversions which are new.   ED ECG REPORT   Date: 08/17/2011   Rate: 79  Rhythm: normal sinus rhythm  QRS Axis: normal  Intervals: normal  ST/T Wave abnormalities: nonspecific T wave changes  Conduction Disutrbances:none  Narrative Interpretation:   Old EKG Reviewed: changes noted and T-wave inversions now found compared to EKG from 06/09/2010   The patient continues to have this uncomfortable feeling in his chest including a pressure. He persistent shortness of  breath.  His BNP is elevated significantly and there is pulmonary edema on his x-ray based on my interpretation and in agreement with the radiologist interpretation..  The patient does not have a fever nor does he have a leukocytosis or a cough. I suspect that it is more likely pulmonary edema an infection at this time.  Cardiac markers are currently negative.  I have discussed his care with the cardiologist who is aware of the T-wave inversions.  He has agreed to see the patient in the emergency department.  Patient is persistently hypertensive currently with a systolic blood pressure of 190, due to his elevated BNP, pulmonary edema and hypertension we'll treat with nitroglycerin drip for similar effected congestive failure treatment. Patient states that he does have increased dyspnea after laying his bed down flat, head of the bed elevated, oxygen increased.  Care was discussed with Dr. Kathrene Bongo of nephrology who agreed with dialysis this morning, also discussed with radiologist who states that he thinks this is all related to pulmonary edema and nocturia primary ischemic event. Care was then discussed with the hospitalist who has agreed with admission to triad team 2 came to a monitored telemetry bed.  CRITICAL  CARE Performed by: Vida Roller   Total critical care time: 35  Critical care time was exclusive of separately billable procedures and treating other patients.  Critical care was necessary to treat or prevent imminent or life-threatening deterioration.  Critical care was time spent personally by me on the following activities: development of treatment plan with patient and/or surrogate as well as nursing, discussions with consultants, evaluation of patient's response to treatment, examination of patient, obtaining history from patient or surrogate, ordering and performing treatments and interventions, ordering and review of laboratory studies, ordering and review of radiographic studies, pulse oximetry and re-evaluation of patient's condition.    Vida Roller, MD 08/17/11 4098  Vida Roller, MD 08/17/11 1191  Vida Roller, MD 08/17/11 4782  Vida Roller, MD 08/17/11 [phone]

## 2011-08-17 NOTE — Progress Notes (Signed)
Subjective:   Asked to see patient emergently due to abnormal echo.   Joel [name] is an 56 y.o. Male with severe HTN, lupus, ESRD on dialysis, non obstructive CAD by cath 6/11 with EF 60-65% (done for abnormal ECG suggested of previous MI).   Apparently had a stress test this past year in [place] which was normal.   Admitted this am with fluid overload  after drinking a lot of water.  pBNP > 70K. Underwent HD today and felt better. CE negative.   Echo today showed EF 45-50% with inf/posterior HK and small to moderate localized anterior pericardial effusion with significant RV collapse. There was no respirophasic changes in MV or TV inflow pattern.   Currently says he feels fine after HD. BP is high. Not tachycardic.     Intake/Output Summary (Last 24 hours) at 08/17/11 1721 Last data filed at 08/17/11 1104  Gross per 24 hour  Intake      0 ml  Output   5250 ml  Net  -5250 ml    Current meds:    . sodium chloride   Intravenous Once  . aspirin  324 mg Oral Once  . aspirin  81 mg Oral Daily  . calcium acetate  2,001 mg Oral TID WC  . ferric gluconate (FERRLECIT/NULECIT) IV  62.5 mg Intravenous Weekly  . heparin  5,000 Units Subcutaneous Q8H  . lanthanum  1,000 mg Oral BID WC  . lisinopril  40 mg Oral Daily  . LORazepam  1 mg Intravenous Once  . multivitamin  1 tablet Oral Daily  . pantoprazole  40 mg Oral Q1200  . paricalcitol  3 mcg Intravenous 3 times weekly  . sodium chloride  3 mL Intravenous Q12H  . DISCONTD: aspirin  81 mg Oral Daily  . DISCONTD: calcium acetate  1,334 mg Oral TID WC  . DISCONTD: fosinopril  40 mg Oral Daily  . DISCONTD: heparin  4,000 Units Intravenous Once  . DISCONTD: lanthanum  1,000 mg Oral BID WC  . DISCONTD: lanthanum  500 mg Oral TID WC  . DISCONTD: nitroGLYCERIN  5-200 mcg/min Intravenous Once  . DISCONTD: paricalcitol  3 mcg Intravenous 3 times weekly   Infusions:     Objective:  Blood pressure 171/95, pulse 89, temperature  98.2 F (36.8 C), temperature source Oral, resp. rate 18, height 5\' 8"  (1.727 m), weight 86.3 kg (190 lb 4.1 oz), SpO2 97.00%. Weight change:   Physical Exam: General:  Well appearing. No resp difficulty HEENT: normal Neck: supple. No obvious JVD   ? Mild Kussmaul's . No lymphadenopathy or thryomegaly appreciated. Cor: PMI nondisplaced. Regular rate & rhythm. Diffuse bruit across precordium - likely due the dialysis fistula.no rub Lungs: clear Abdomen: soft, nontender, nondistended. No hepatosplenomegaly. No bruits or masses. Good bowel sounds. Extremities: no cyanosis, clubbing, rash, edema Neuro: alert & orientedx3, cranial nerves grossly intact. moves all 4 extremities w/o difficulty. Affect pleasant   Lab Results: Basic Metabolic Panel:  Lab 08/17/11 4782 08/17/11 0301 08/17/11 0248  NA -- 142 142  K -- 4.8 4.9  CL -- 102 95*  CO2 -- -- 32  GLUCOSE -- 74 78  BUN -- 75* 74*  CREATININE 7.69* 11.10* 12.91*  CALCIUM -- -- 9.5  MG -- -- --  PHOS -- -- --   Liver Function Tests: No results found for this basename: AST:5,ALT:5,ALKPHOS:5,BILITOT:5,PROT:5,ALBUMIN:5 in the last 168 hours No results found for this basename: LIPASE:5,AMYLASE:5 in the last 168 hours No results found for  this basename: AMMONIA:5 in the last 168 hours CBC:  Lab 08/17/11 1237 08/17/11 0301 08/17/11 0248  WBC 5.3 -- 6.1  NEUTROABS -- -- 3.6  HGB 12.4* 12.6* 11.3*  HCT 37.2* 37.0* 35.3*  MCV 85.3 -- 87.8  PLT 219 -- 213   Cardiac Enzymes:  Lab 08/17/11 1518  CKTOTAL 111  CKMB 4.1*  CKMBINDEX --  TROPONINI <0.30   BNP: No components found with this basename: POCBNP:5 CBG: No results found for this basename: GLUCAP:5 in the last 168 hours Microbiology: Lab Results  Component Value Date   CULT NO GROWTH 5 DAYS 01/19/2008   CULT NO GROWTH 5 DAYS 01/19/2008   CULT NO GROWTH 01/19/2008   No results found for this basename: CULT:2,SDES:2 in the last 168 hours  Imaging: Dg Chest Port 1  View  08/17/2011  *RADIOLOGY REPORT*  Clinical Data: Shortness of breath and chest pain.  PORTABLE CHEST - 1 VIEW  Comparison: Chest radiograph performed 11/25/2010  Findings: The lungs are well-aerated.  New vascular congestion is noted, with bilateral central airspace opacities, left greater than right.  This may reflect asymmetric pulmonary edema or possibly pneumonia.  There is a persistent chronic small left pleural effusion.  No pneumothorax is seen.  The cardiomediastinal silhouette is borderline normal in size; calcification is noted within the aortic arch.  No acute osseous abnormalities are seen.  IMPRESSION: New vascular congestion, with bilateral central airspace opacities, left greater than right.  This may reflect asymmetric pulmonary edema or possibly pneumonia.  Chronic small left pleural effusion again seen.  Original Report Authenticated By: Tonia Ghent, M.D.   ECG: NSR 79 LVH with repol. Anterior Qwaves. No alternans   ASSESSMENT:  1. Diastolic HF in setting of dietary non-compliance 2. ESRD 3. Small to moderate pericardial effusion with RV collapse but no respirophasic changes in MV/TV inflow.  4. New inferior/posterior wall motion abnormality on echo 5. Non-obstructive CAD on cath 6/11 6. Severe HTN 7. Lupus 8. Tobacco use quit last weak   PLAN/DISCUSSION:  Echo reviewed at length and discussed with Drs. Myrtis Ser and Tyrone Sage. He has what appears to be a small to moderate localized pericardial effusion over the RV with significant RV collapse. However,  clinical presentation not at all consistent with periciarial tamponade - his BP is actually high, he is not dyspneic and HR is normal. That said he does have new wall motion abnormality on echo suggestive of inferior posterior infarct.   I discussed the options with him at length. Will plan R & L heart cath tomorrow to assess coronaries and also evaluate his hemodynamics. If any evidence of constrictive physiology can consider  pericardial window, otherwise would just follow clinically and repeat echo in 3-4 weeks.   Total time spent 1 hour   Truman Hayward 5:51 PM     LOS: 0 days    Arvilla Meres, MD 08/17/2011, 5:21 PM

## 2011-08-18 ENCOUNTER — Other Ambulatory Visit: Payer: Self-pay

## 2011-08-18 ENCOUNTER — Encounter (HOSPITAL_COMMUNITY)
Admission: EM | Disposition: A | Discharge status: Home / Self Care | Payer: Self-pay | Source: Ambulatory Visit | Attending: Internal Medicine

## 2011-08-18 DIAGNOSIS — I2510 Atherosclerotic heart disease of native coronary artery without angina pectoris: Secondary | ICD-10-CM

## 2011-08-18 DIAGNOSIS — I3139 Other pericardial effusion (noninflammatory): Secondary | ICD-10-CM

## 2011-08-18 DIAGNOSIS — N186 End stage renal disease: Secondary | ICD-10-CM | POA: Diagnosis present

## 2011-08-18 DIAGNOSIS — I313 Pericardial effusion (noninflammatory): Secondary | ICD-10-CM

## 2011-08-18 HISTORY — PX: LEFT AND RIGHT HEART CATHETERIZATION WITH CORONARY ANGIOGRAM: SHX5449

## 2011-08-18 HISTORY — DX: Pericardial effusion (noninflammatory): I31.3

## 2011-08-18 HISTORY — DX: Other pericardial effusion (noninflammatory): I31.39

## 2011-08-18 HISTORY — PX: PERCUTANEOUS CORONARY STENT INTERVENTION (PCI-S): SHX5485

## 2011-08-18 LAB — POCT I-STAT 3, ART BLOOD GAS (G3+)
Acid-Base Excess: 1 mmol/L (ref 0.0–2.0)
Bicarbonate: 26.5 mEq/L — ABNORMAL HIGH (ref 20.0–24.0)
TCO2: 28 mmol/L (ref 0–100)
pH, Arterial: 7.394 (ref 7.350–7.450)

## 2011-08-18 LAB — RENAL FUNCTION PANEL
Albumin: 3 g/dL — ABNORMAL LOW (ref 3.5–5.2)
BUN: 53 mg/dL — ABNORMAL HIGH (ref 6–23)
Calcium: 9.4 mg/dL (ref 8.4–10.5)
Creatinine, Ser: 10.38 mg/dL — ABNORMAL HIGH (ref 0.50–1.35)
Glucose, Bld: 90 mg/dL (ref 70–99)
Phosphorus: 6.4 mg/dL — ABNORMAL HIGH (ref 2.3–4.6)
Potassium: 4.6 mEq/L (ref 3.5–5.1)

## 2011-08-18 LAB — POCT I-STAT 3, VENOUS BLOOD GAS (G3P V)
Acid-base deficit: 1 mmol/L (ref 0.0–2.0)
Bicarbonate: 24.9 mEq/L — ABNORMAL HIGH (ref 20.0–24.0)
pH, Ven: 7.347 — ABNORMAL HIGH (ref 7.250–7.300)
pO2, Ven: 36 mmHg (ref 30.0–45.0)

## 2011-08-18 LAB — POCT ACTIVATED CLOTTING TIME: Activated Clotting Time: 551 seconds

## 2011-08-18 LAB — GLUCOSE, CAPILLARY: Glucose-Capillary: 131 mg/dL — ABNORMAL HIGH (ref 70–99)

## 2011-08-18 SURGERY — LEFT AND RIGHT HEART CATHETERIZATION WITH CORONARY ANGIOGRAM
Laterality: Right

## 2011-08-18 MED ORDER — NITROGLYCERIN 0.2 MG/ML ON CALL CATH LAB
INTRAVENOUS | Status: AC
  Filled 2011-08-18: qty 1

## 2011-08-18 MED ORDER — LIDOCAINE HCL (PF) 1 % IJ SOLN
INTRAMUSCULAR | Status: AC
  Filled 2011-08-18: qty 30

## 2011-08-18 MED ORDER — BIVALIRUDIN 250 MG IV SOLR
INTRAVENOUS | Status: AC
  Filled 2011-08-18: qty 250

## 2011-08-18 MED ORDER — NITROGLYCERIN IN D5W 200-5 MCG/ML-% IV SOLN
2.0000 ug/min | INTRAVENOUS | Status: DC
  Administered 2011-08-18: 20 ug/min via INTRAVENOUS

## 2011-08-18 MED ORDER — CLOPIDOGREL BISULFATE 75 MG PO TABS
75.0000 mg | ORAL_TABLET | Freq: Every day | ORAL | Status: DC
  Administered 2011-08-19 – 2011-08-21 (×3): 75 mg via ORAL
  Filled 2011-08-18 (×2): qty 1

## 2011-08-18 MED ORDER — ASPIRIN 81 MG PO CHEW
81.0000 mg | CHEWABLE_TABLET | Freq: Every day | ORAL | Status: DC
  Administered 2011-08-19 – 2011-08-21 (×3): 81 mg via ORAL
  Filled 2011-08-18 (×3): qty 1

## 2011-08-18 MED ORDER — CLOPIDOGREL BISULFATE 300 MG PO TABS
ORAL_TABLET | ORAL | Status: AC
  Administered 2011-08-19: 75 mg via ORAL
  Filled 2011-08-18: qty 2

## 2011-08-18 MED ORDER — CARVEDILOL 6.25 MG PO TABS
6.2500 mg | ORAL_TABLET | ORAL | Status: AC
  Administered 2011-08-19: 6.25 mg via ORAL
  Filled 2011-08-18: qty 1

## 2011-08-18 MED ORDER — HYDRALAZINE HCL 20 MG/ML IJ SOLN
10.0000 mg | Freq: Four times a day (QID) | INTRAMUSCULAR | Status: DC | PRN
  Filled 2011-08-18: qty 0.5

## 2011-08-18 MED ORDER — CLONIDINE HCL 0.1 MG PO TABS
0.1000 mg | ORAL_TABLET | Freq: Once | ORAL | Status: AC
  Administered 2011-08-19: 0.1 mg via ORAL
  Filled 2011-08-18: qty 1

## 2011-08-18 MED ORDER — MIDAZOLAM HCL 2 MG/2ML IJ SOLN
INTRAMUSCULAR | Status: AC
  Filled 2011-08-18: qty 2

## 2011-08-18 MED ORDER — ROSUVASTATIN CALCIUM 10 MG PO TABS
10.0000 mg | ORAL_TABLET | Freq: Every day | ORAL | Status: DC
  Administered 2011-08-19 – 2011-08-20 (×2): 10 mg via ORAL
  Filled 2011-08-18 (×3): qty 1

## 2011-08-18 MED ORDER — CARVEDILOL 6.25 MG PO TABS
6.2500 mg | ORAL_TABLET | Freq: Two times a day (BID) | ORAL | Status: DC
  Administered 2011-08-19 – 2011-08-20 (×2): 6.25 mg via ORAL
  Filled 2011-08-18 (×5): qty 1

## 2011-08-18 MED ORDER — FENTANYL CITRATE 0.05 MG/ML IJ SOLN
INTRAMUSCULAR | Status: AC
  Filled 2011-08-18: qty 4

## 2011-08-18 MED ORDER — FENTANYL CITRATE 0.05 MG/ML IJ SOLN
INTRAMUSCULAR | Status: AC
  Filled 2011-08-18: qty 2

## 2011-08-18 MED ORDER — HEPARIN (PORCINE) IN NACL 2-0.9 UNIT/ML-% IJ SOLN
INTRAMUSCULAR | Status: AC
  Filled 2011-08-18: qty 2000

## 2011-08-18 MED ORDER — LABETALOL HCL 5 MG/ML IV SOLN
INTRAVENOUS | Status: AC
  Filled 2011-08-18: qty 4

## 2011-08-18 NOTE — Progress Notes (Signed)
Pt walked off unit and I found him in short stay area watching TV.  He had walked off unit two times yesterday 08/17/11 with his sister without loss of signal.  Security was called all times and pt returned before security located him.  I explained to him the importance of staying in range of unit.  He verbalized understanding of staying on the unit and only traveling as far as Doctor, general practice and hallway.  Will continue to monitor. Thomas Hoff

## 2011-08-18 NOTE — H&P (View-Only) (Signed)
 Subjective:   Asked to see patient emergently due to abnormal echo.   Joel [name] is an 55 y.o. Male with severe HTN, lupus, ESRD on dialysis, non obstructive CAD by cath 6/11 with EF 60-65% (done for abnormal ECG suggested of previous MI).   Apparently had a stress test this past year in [place] which was normal.   Admitted this am with fluid overload  after drinking a lot of water.  pBNP > 70K. Underwent HD today and felt better. CE negative.   Echo today showed EF 45-50% with inf/posterior HK and small to moderate localized anterior pericardial effusion with significant RV collapse. There was no respirophasic changes in MV or TV inflow pattern.   Currently says he feels fine after HD. BP is high. Not tachycardic.     Intake/Output Summary (Last 24 hours) at 08/17/11 1721 Last data filed at 08/17/11 1104  Gross per 24 hour  Intake      0 ml  Output   5250 ml  Net  -5250 ml    Current meds:    . sodium chloride   Intravenous Once  . aspirin  324 mg Oral Once  . aspirin  81 mg Oral Daily  . calcium acetate  2,001 mg Oral TID WC  . ferric gluconate (FERRLECIT/NULECIT) IV  62.5 mg Intravenous Weekly  . heparin  5,000 Units Subcutaneous Q8H  . lanthanum  1,000 mg Oral BID WC  . lisinopril  40 mg Oral Daily  . LORazepam  1 mg Intravenous Once  . multivitamin  1 tablet Oral Daily  . pantoprazole  40 mg Oral Q1200  . paricalcitol  3 mcg Intravenous 3 times weekly  . sodium chloride  3 mL Intravenous Q12H  . DISCONTD: aspirin  81 mg Oral Daily  . DISCONTD: calcium acetate  1,334 mg Oral TID WC  . DISCONTD: fosinopril  40 mg Oral Daily  . DISCONTD: heparin  4,000 Units Intravenous Once  . DISCONTD: lanthanum  1,000 mg Oral BID WC  . DISCONTD: lanthanum  500 mg Oral TID WC  . DISCONTD: nitroGLYCERIN  5-200 mcg/min Intravenous Once  . DISCONTD: paricalcitol  3 mcg Intravenous 3 times weekly   Infusions:     Objective:  Blood pressure 171/95, pulse 89, temperature  98.2 F (36.8 C), temperature source Oral, resp. rate 18, height 5' 8" (1.727 m), weight 86.3 kg (190 lb 4.1 oz), SpO2 97.00%. Weight change:   Physical Exam: General:  Well appearing. No resp difficulty HEENT: normal Neck: supple. No obvious JVD   ? Mild Kussmaul's . No lymphadenopathy or thryomegaly appreciated. Cor: PMI nondisplaced. Regular rate & rhythm. Diffuse bruit across precordium - likely due the dialysis fistula.no rub Lungs: clear Abdomen: soft, nontender, nondistended. No hepatosplenomegaly. No bruits or masses. Good bowel sounds. Extremities: no cyanosis, clubbing, rash, edema Neuro: alert & orientedx3, cranial nerves grossly intact. moves all 4 extremities w/o difficulty. Affect pleasant   Lab Results: Basic Metabolic Panel:  Lab 08/17/11 1237 08/17/11 0301 08/17/11 0248  NA -- 142 142  K -- 4.8 4.9  CL -- 102 95*  CO2 -- -- 32  GLUCOSE -- 74 78  BUN -- 75* 74*  CREATININE 7.69* 11.10* 12.91*  CALCIUM -- -- 9.5  MG -- -- --  PHOS -- -- --   Liver Function Tests: No results found for this basename: AST:5,ALT:5,ALKPHOS:5,BILITOT:5,PROT:5,ALBUMIN:5 in the last 168 hours No results found for this basename: LIPASE:5,AMYLASE:5 in the last 168 hours No results found for   this basename: AMMONIA:5 in the last 168 hours CBC:  Lab 08/17/11 1237 08/17/11 0301 08/17/11 0248  WBC 5.3 -- 6.1  NEUTROABS -- -- 3.6  HGB 12.4* 12.6* 11.3*  HCT 37.2* 37.0* 35.3*  MCV 85.3 -- 87.8  PLT 219 -- 213   Cardiac Enzymes:  Lab 08/17/11 1518  CKTOTAL 111  CKMB 4.1*  CKMBINDEX --  TROPONINI <0.30   BNP: No components found with this basename: POCBNP:5 CBG: No results found for this basename: GLUCAP:5 in the last 168 hours Microbiology: Lab Results  Component Value Date   CULT NO GROWTH 5 DAYS 01/19/2008   CULT NO GROWTH 5 DAYS 01/19/2008   CULT NO GROWTH 01/19/2008   No results found for this basename: CULT:2,SDES:2 in the last 168 hours  Imaging: Dg Chest Port 1  View  08/17/2011  *RADIOLOGY REPORT*  Clinical Data: Shortness of breath and chest pain.  PORTABLE CHEST - 1 VIEW  Comparison: Chest radiograph performed 11/25/2010  Findings: The lungs are well-aerated.  New vascular congestion is noted, with bilateral central airspace opacities, left greater than right.  This may reflect asymmetric pulmonary edema or possibly pneumonia.  There is a persistent chronic small left pleural effusion.  No pneumothorax is seen.  The cardiomediastinal silhouette is borderline normal in size; calcification is noted within the aortic arch.  No acute osseous abnormalities are seen.  IMPRESSION: New vascular congestion, with bilateral central airspace opacities, left greater than right.  This may reflect asymmetric pulmonary edema or possibly pneumonia.  Chronic small left pleural effusion again seen.  Original Report Authenticated By: JEFFREY CHANG, M.D.   ECG: NSR 79 LVH with repol. Anterior Qwaves. No alternans   ASSESSMENT:  1. Diastolic HF in setting of dietary non-compliance 2. ESRD 3. Small to moderate pericardial effusion with RV collapse but no respirophasic changes in MV/TV inflow.  4. New inferior/posterior wall motion abnormality on echo 5. Non-obstructive CAD on cath 6/11 6. Severe HTN 7. Lupus 8. Tobacco use quit last weak   PLAN/DISCUSSION:  Echo reviewed at length and discussed with Drs. Katz and Gerhardt. He has what appears to be a small to moderate localized pericardial effusion over the RV with significant RV collapse. However,  clinical presentation not at all consistent with periciarial tamponade - his BP is actually high, he is not dyspneic and HR is normal. That said he does have new wall motion abnormality on echo suggestive of inferior posterior infarct.   I discussed the options with him at length. Will plan R & L heart cath tomorrow to assess coronaries and also evaluate his hemodynamics. If any evidence of constrictive physiology can consider  pericardial window, otherwise would just follow clinically and repeat echo in 3-4 weeks.   Total time spent 1 hour   Lincon Sahlin,MD 5:51 PM     LOS: 0 days    Orval Dortch, MD 08/17/2011, 5:21 PM  

## 2011-08-18 NOTE — Progress Notes (Signed)
Patient ID: Joel [name], male   DOB: Nov 14, 1955, 56 y.o.   MRN: [id number] S:breathing much better   O: BP 164/84  Pulse 83  Temp(Src) 97.7 F (36.5 C) (Oral)  Resp 18  Ht 5\' 8"  (1.727 m)  Wt 85.7 kg (188 lb 15 oz)  BMI 28.73 kg/m2  SpO2 95%  Gen:WD WN AAM in NAD CVS:RRR Resp:CTA JYN:WGNFAO Ext:no edema, left UA AVF +T/B      . aspirin  324 mg Oral Pre-Cath  . aspirin  81 mg Oral Daily  . calcium acetate  2,001 mg Oral TID WC  . ferric gluconate (FERRLECIT/NULECIT) IV  62.5 mg Intravenous Weekly  . heparin  5,000 Units Subcutaneous Q8H  . lanthanum  1,000 mg Oral BID WC  . lisinopril  40 mg Oral Daily  . multivitamin  1 tablet Oral Daily  . pantoprazole  40 mg Oral Q1200  . paricalcitol  3 mcg Intravenous 3 times weekly  . sodium chloride  3 mL Intravenous Q12H  . sodium chloride  3 mL Intravenous Q12H  . DISCONTD: aspirin  81 mg Oral Daily  . DISCONTD: calcium acetate  1,334 mg Oral TID WC  . DISCONTD: fosinopril  40 mg Oral Daily  . DISCONTD: lanthanum  1,000 mg Oral BID WC  . DISCONTD: lanthanum  500 mg Oral TID WC  . DISCONTD: nitroGLYCERIN  5-200 mcg/min Intravenous Once  . DISCONTD: paricalcitol  3 mcg Intravenous 3 times weekly    BMET  Lab 08/18/11 0533 08/17/11 1237 08/17/11 0301 08/17/11 0248  NA 139 -- 142 142  K 4.6 -- 4.8 4.9  CL 97 -- 102 95*  CO2 28 -- -- 32  GLUCOSE 90 -- 74 78  BUN 53* -- 75* 74*  CREATININE 10.38* 7.69* 11.10* 12.91*  ALB -- -- -- --  CALCIUM 9.4 -- -- 9.5  PHOS 6.4* -- -- --   CBC  Lab 08/17/11 1237 08/17/11 0301 08/17/11 0248  WBC 5.3 -- 6.1  NEUTROABS -- -- 3.6  HGB 12.4* 12.6* 11.3*  HCT 37.2* 37.0* 35.3*  MCV 85.3 -- 87.8  PLT 219 -- 213    @IMGRELPRIORS @  Assessment/Plan: 1.SOB- markedly improved with UF.  Normal EDW is 87 kg, removed >5L and was 85.7kg.  Will continue to challenge EDW 2.ESRDcont with HD qMWF 3. Anemia:hold epo 4. Pericardial effusion- for R and L heart cath today 5. Nutrition:  stable 6. Hypertension:stable 7. vasc access- stable Tanaysha Alkins A

## 2011-08-18 NOTE — Op Note (Signed)
    Cardiac Cath Note  Joel [name] [id number] [birth year]  Procedure: Right and left Heart Cardiac Catheterization Note Indications: Evaluate pericardial effusion-rule out constrictive pericarditis patient presented with chest pains.  Procedure Details Consent: Obtained Time Out: Verified patient identification, verified procedure, site/side was marked, verified correct patient position, special equipment/implants available, Radiology Safety Procedures followed,  medications/allergies/relevent history reviewed, required imaging and test results available.  Performed   Medications: Fentanyl: 50 mcg IV Versed: 2 mg IV  The right femoral artery and right femoral vein were  easily canulated using a modified Seldinger technique.  Hemodynamics:   RA: 8/5, mean of 5 RV: 27 /1, 4 PCWP: 10 PA:  25/11 - mean of 18  Cardiac Output   Thermodilution: 5.78 m/m, index of 2.9  Fick : 5.09 L per minute, index of 2.55  Arterial Sat: 96% PA Sat: 65%.  LV pressure: 155/3 Aortic pressure: 175/96  Angiography   Left Main: The left main is extremely large. It is normal. The coronary arteries have a very high flow rate.  Left anterior Descending: The left anterior descending artery is a moderate to large vessel. It is tortuous. There are no significant irregularities in the LAD. The LAD gives off 2 moderate-sized diagonal branches and several small diagonal branches all of these are normal.  Left Circumflex: The left circumflex system is a very large system. It gives off 2 high acute marginal arteries which are normal. The continuation branch his normal meds plus a third obtuse marginal artery which is normal. The posterior lateral is normal.  Right Coronary Artery: The right coronary artery is moderate in size and is dominant. There is a proximal 20-30% stenosis followed by sequential 80-90% stenoses in the mid vessel. The distal right coronary artery is unremarkable. The posterior descending  artery and the posterior lateral segment artery are normal.  LV Gram: Left ventricular gram was performed in the 30 RAO position. It reveals well preserved left ventricle systolic function with an ejection fraction of 50-55%.  Complications: No apparent complications Patient did tolerate procedure well.  Conclusions:   1. Single vessel coronary artery disease involving the right mid right coronary artery 2. Normal right-sided heart pressures. There is no evidence of pericardial Nod or constrictive pericarditis.   3. Normal left ventricle systolic function with normal cardiac output.  I talked to Dr. Kirke Corin. He will proceed with PCI of the right coronary artery.  Joel [name], Joel [name]., MD, [facility] 08/18/2011, 4:55 PM

## 2011-08-18 NOTE — Op Note (Signed)
CARDIAC CATH NOTE  Name: Joel [name] MRN: [id number] DOB: 1956/06/02  Procedure: PTCA and stenting of the proximal right coronary artery with a bare-metal stent.  Indication: This is a 56 year old male with end-stage renal disease and hemodialysis. He presented with dyspnea and fluid overload. His echo showed moderate size pericardial effusion with RV collapse. He was referred for a right and left cardiac catheterization which was done by Dr. Elease Hashimoto. It basically showed no tamponade or constriction physiology. Coronary angiography showed a 95% proximal RCA stenosis.   Procedural Details: The 5 French sheath was exchanged into the 6 Fr sheath.  Weight-based bivalirudin was given for anticoagulation. Once a therapeutic ACT was achieved, a 6 [place] JR 4 guide catheter was inserted.  A P[place]ater coronary guidewire was used to cross the lesion.  The lesion was predilated with a 2.5 x 20 mm balloon x3 inflations.  The lesion was then stented with a 3.5 x 28 mm vision bare-metal stent. The stent covered the 2 tandem lesions. There was another more proximal 30% stenosis which I elected not to treat.  The stent was postdilated with a 3.75 noncompliant balloon.  Following PCI, there was 0% residual stenosis and TIMI-3 flow. Final angiography confirmed an excellent result. The patient tolerated the procedure well. There were no immediate procedural complications. Femoral hemostasis was achieved with Perclose device. The patient was transferred to the post catheterization recovery area for further monitoring.  PCI Data: Vessel - RCA/Segment - proximal Percent Stenosis (pre)  95% TIMI-flow 3 Stent 3.5 x 28 mm vision bare-metal stent postdilated with a 3.75 Percent Stenosis (post) 0 TIMI-flow (post) 3  Final Conclusions:  Successful angioplasty and bare-metal stent placement to the proximal right coronary artery. Initial stenosis was 95% which was reduced to 0%. A bare-metal stent was chosen due to the  patient's difficulty tolerating antiplatelet therapy.  Recommendations:  I recommend dual antiplatelet therapy for at least one month and ideally for 3 months. The patient should stay on low dose aspirin indefinitely if possible. Aggressive treatment of risk factors is recommended. The patient was very hypertensive throughout the case. Recommend blood pressure control. He was started on nitroglycerin drip. I will also add Coreg.  Lorine Bears MD, [facility] 08/18/2011, 6:17 PM

## 2011-08-18 NOTE — Interval H&P Note (Signed)
History and Physical Interval Note:  08/18/2011 4:13 PM  Joel [name]  has presented today for surgery, with the diagnosis of Abnormal echo  The various methods of treatment have been discussed with the patient and family. After consideration of risks, benefits and other options for treatment, the patient has consented to  Procedure(s): LEFT AND RIGHT HEART CATHETERIZATION WITH CORONARY/GRAFT ANGIOGRAM as a surgical intervention .  The patients' history has been reviewed, patient examined, no change in status, stable for surgery.  I have reviewed the patients' chart and labs.  Questions were answered to the patient's satisfaction.     Elyn Aquas.

## 2011-08-18 NOTE — Brief Op Note (Signed)
    Cardiac Cath Note  Joel [name] [id number] 12/29/1955  Procedure: Right and left Heart Cardiac Catheterization Note Indications: Evaluate pericardial effusion-rule out constrictive pericarditis patient presented with chest pains.  Procedure Details Consent: Obtained Time Out: Verified patient identification, verified procedure, site/side was marked, verified correct patient position, special equipment/implants available, Radiology Safety Procedures followed,  medications/allergies/relevent history reviewed, required imaging and test results available.  Performed   Medications: Fentanyl: 50 mcg IV Versed: 2 mg IV  The right femoral artery and right femoral vein were  easily canulated using a modified Seldinger technique.  Hemodynamics:   RA: 8/5, mean of 5 RV: 27 /1, 4 PCWP: 10 PA:  25/11 - mean of 18  Cardiac Output   Thermodilution: 5.78 m/m, index of 2.9  Fick : 5.09 L per minute, index of 2.55  Arterial Sat: 96% PA Sat: 65%.  LV pressure: 155/3 Aortic pressure: 175/96  Angiography   Left Main: The left main is extremely large. It is normal. The coronary arteries have a very high flow rate.  Left anterior Descending: The left anterior descending artery is a moderate to large vessel. It is tortuous. There are no significant irregularities in the LAD. The LAD gives off 2 moderate-sized diagonal branches and several small diagonal branches all of these are normal.  Left Circumflex: The left circumflex system is a very large system. It gives off 2 high acute marginal arteries which are normal. The continuation branch his normal meds plus a third obtuse marginal artery which is normal. The posterior lateral is normal.  Right Coronary Artery: The right coronary artery is moderate in size and is dominant. There is a proximal 20-30% stenosis followed by sequential 80-90% stenoses in the mid vessel. The distal right coronary artery is unremarkable. The posterior descending  artery and the posterior lateral segment artery are normal.  LV Gram: Left ventricular gram was performed in the 30 RAO position. It reveals well preserved left ventricle systolic function with an ejection fraction of 50-55%.  Complications: No apparent complications Patient did tolerate procedure well.  Conclusions:   1. Single vessel coronary artery disease involving the right mid right coronary artery 2. Normal right-sided heart pressures. There is no evidence of pericardial Nod or constrictive pericarditis.   3. Normal left ventricle systolic function with normal cardiac output.  I talked to Dr. Arida. He will proceed with PCI of the right coronary artery.  Philip J. Nahser, Jr., MD, FACC 08/18/2011, 4:55 PM  

## 2011-08-18 NOTE — Progress Notes (Signed)
Site area: right groin  Site Prior to Removal:  Level 0  Pressure Applied For 15 MINUTES    Minutes Beginning at 2030  Manual:   yes  Patient Status During Pull:  VSS  Post Pull Groin Site:  Level 0  Post Pull Instructions Given:  yes  Post Pull Pulses Present:  yes  Dressing Applied:  yes  Comments:  Stable; venous sheath was removed.

## 2011-08-18 NOTE — Progress Notes (Signed)
Contacted Dr. Margo Aye about pt being on NTG drip.  Pt states that he has a constant headache (7/10) and that he does not want to be on the NTG drip.  Dr. Margo Aye and I discussed giving the pt PO coreg and clonidine for BP, as well as having hydralazine PRN for BP so that we could get the pt off of the NTG completely sooner than later.  Orders were received. Will continue to assess.

## 2011-08-18 NOTE — Progress Notes (Signed)
Patient Name: Joel [name] Date of Encounter: 08/18/2011     Principal Problem:  *Pulmonary edema, acute Active Problems:  Malignant hypertension  DEGENERATIVE DISC DISEASE  Fluid overload  Pericardial effusion  Abnormal echocardiogram  ESRD (end stage renal disease)    SUBJECTIVE:  Breathing much improved.  Awaiting r & l heart cath.  Questions answered.   CURRENT MEDS    . aspirin  324 mg Oral Pre-Cath  . aspirin  81 mg Oral Daily  . calcium acetate  2,001 mg Oral TID WC  . ferric gluconate (FERRLECIT/NULECIT) IV  62.5 mg Intravenous Weekly  . heparin  5,000 Units Subcutaneous Q8H  . lanthanum  1,000 mg Oral BID WC  . lisinopril  40 mg Oral Daily  . multivitamin  1 tablet Oral Daily  . pantoprazole  40 mg Oral Q1200  . paricalcitol  3 mcg Intravenous 3 times weekly    OBJECTIVE  Filed Vitals:   08/17/11 1151 08/17/11 1417 08/17/11 2118 08/18/11 0537  BP: 167/99 171/95 176/98 164/84  Pulse: 83 89 79 83  Temp: 97.7 F (36.5 C) 98.2 F (36.8 C) 98.4 F (36.9 C) 97.7 F (36.5 C)  TempSrc: Oral Oral Oral Oral  Resp: 18 18 18 18   Height: 5\' 8"  (1.727 m)     Weight: 190 lb 4.1 oz (86.3 kg)   188 lb 15 oz (85.7 kg)  SpO2: 96% 97% 95% 95%    Intake/Output Summary (Last 24 hours) at 08/18/11 0926 Last data filed at 08/18/11 0600  Gross per 24 hour  Intake    240 ml  Output   5251 ml  Net  -5011 ml    PHYSICAL EXAM  General: Well developed, well nourished, in no acute distress. Head: Normocephalic, atraumatic, sclera non-icteric, no xanthomas, nares are without discharge.  Neck: Supple without bruits or JVD. Lungs:  Resp regular and unlabored, CTA. Heart: RRR no s3, + s4, or murmurs.  No rub. Abdomen: Soft, non-tender, non-distended, BS + x 4.  Msk:  Strength and tone appears normal for age. Extremities: No clubbing, cyanosis or edema. DP/PT/Radials 2+ and equal bilaterally.  L arm avf w/ +bruit/thrill. Neuro: Alert and oriented X 3. Moves all  extremities spontaneously. Psych: Normal affect.  LABS:  CBC:  Basename 08/17/11 1237 08/17/11 0301 08/17/11 0248  WBC 5.3 -- 6.1  NEUTROABS -- -- 3.6  HGB 12.4* 12.6* --  HCT 37.2* 37.0* --  MCV 85.3 -- 87.8  PLT 219 -- 213   Basic Metabolic Panel:  Basename 08/18/11 0533 08/17/11 1237 08/17/11 0301 08/17/11 0248  NA 139 -- 142 --  K 4.6 -- 4.8 --  CL 97 -- 102 --  CO2 28 -- -- 32  GLUCOSE 90 -- 74 --  BUN 53* -- 75* --  CREATININE 10.38* 7.69* -- --  CALCIUM 9.4 -- -- 9.5  MG -- -- -- --  PHOS 6.4* -- -- --   Liver Function Tests:  Basename 08/18/11 0533  AST --  ALT --  ALKPHOS --  BILITOT --  PROT --  ALBUMIN 3.0*   Cardiac Enzymes:  Basename 08/17/11 1518  CKTOTAL 111  CKMB 4.1*  CKMBINDEX --  TROPONINI <0.30    TELE  RSR  2D echo 08/17/2011 Study Conclusions  - Left ventricle: The cavity size was normal. Wall thickness was increased in a pattern of moderate LVH. Systolic function was mildly reduced. The estimated ejection fraction was in the range of 45% to 50%. Doppler parameters are  consistent with abnormal left ventricular relaxation (grade 1 diastolic dysfunction). Doppler parameters are consistent with elevated mean left atrial filling pressure. - Regional wall motion abnormality: Hypokinesis of the entire inferior and basal-mid inferolateral myocardium. - Left atrium: The atrium was mildly dilated. - Right ventricle: The cavity size was below normal. - Systemic veins: Not visualized. - Pericardium, extracardiac: There was moderate thickening. A pericardial effusion was identified. There was chamber collapse. Respirophasic change in stroke volume was normal. Features were consistent with tamponade physiology.     ASSESSMENT AND PLAN:  1.  Moderate Pericardial Effusion:  Currently asymptomatic.  For R & L heart cath today.  2.  H/O Exertional chest pain:  For cath today.  Cont asa.  3.  Dypsnea/Volume overload:  Volume mgmt  per renal.  Feeling much better.  4.  ESRD:  HD per renal.  5.  HTN:  Pressures trending high.  Somewhat lower throughout day yesterday.  On lisinopril 40.  Consider additional agent.  6.  Tob Abuse:  Cessation advised.   Signed, Nicolasa Ducking NP

## 2011-08-19 ENCOUNTER — Other Ambulatory Visit: Payer: Self-pay

## 2011-08-19 ENCOUNTER — Encounter (HOSPITAL_COMMUNITY): Payer: Self-pay | Admitting: Physician Assistant

## 2011-08-19 ENCOUNTER — Inpatient Hospital Stay (HOSPITAL_COMMUNITY): Payer: Medicaid Other

## 2011-08-19 DIAGNOSIS — I2510 Atherosclerotic heart disease of native coronary artery without angina pectoris: Secondary | ICD-10-CM

## 2011-08-19 LAB — CBC
MCV: 87 fL (ref 78.0–100.0)
Platelets: 204 10*3/uL (ref 150–400)
RBC: 4.24 MIL/uL (ref 4.22–5.81)
RDW: 15 % (ref 11.5–15.5)
WBC: 4.7 10*3/uL (ref 4.0–10.5)

## 2011-08-19 LAB — BASIC METABOLIC PANEL
CO2: 27 mEq/L (ref 19–32)
Chloride: 95 mEq/L — ABNORMAL LOW (ref 96–112)
GFR calc Af Amer: 4 mL/min — ABNORMAL LOW (ref >90)
Potassium: 4.5 mEq/L (ref 3.5–5.1)
Sodium: 137 mEq/L (ref 135–145)

## 2011-08-19 LAB — PHOSPHORUS: Phosphorus: 7.1 mg/dL — ABNORMAL HIGH (ref 2.3–4.6)

## 2011-08-19 MED ORDER — PARICALCITOL 5 MCG/ML IV SOLN
INTRAVENOUS | Status: AC
  Filled 2011-08-19: qty 1

## 2011-08-19 MED ORDER — ALUM & MAG HYDROXIDE-SIMETH 200-200-20 MG/5ML PO SUSP
30.0000 mL | Freq: Three times a day (TID) | ORAL | Status: DC | PRN
  Administered 2011-08-19 (×2): 30 mL via ORAL
  Filled 2011-08-19 (×2): qty 30

## 2011-08-19 MED ORDER — ONDANSETRON HCL 4 MG/2ML IJ SOLN: 4.0000 mg | Freq: Four times a day (QID) | INTRAMUSCULAR | Status: DC | PRN

## 2011-08-19 MED ORDER — ACETAMINOPHEN 325 MG PO TABS
650.0000 mg | ORAL_TABLET | ORAL | Status: DC | PRN
  Administered 2011-08-19 – 2011-08-21 (×3): 650 mg via ORAL

## 2011-08-19 MED ORDER — ACETAMINOPHEN 325 MG PO TABS
ORAL_TABLET | ORAL | Status: AC
  Filled 2011-08-19: qty 2

## 2011-08-19 MED ORDER — NITROGLYCERIN 0.4 MG SL SUBL
SUBLINGUAL_TABLET | SUBLINGUAL | Status: AC
  Filled 2011-08-19: qty 25

## 2011-08-19 MED ORDER — NITROGLYCERIN 0.3 MG SL SUBL
0.3000 mg | SUBLINGUAL_TABLET | SUBLINGUAL | Status: DC | PRN
  Administered 2011-08-19: 0.3 mg via SUBLINGUAL

## 2011-08-19 MED FILL — Dextrose Inj 5%: INTRAVENOUS | Qty: 50 | Status: AC

## 2011-08-19 NOTE — Progress Notes (Signed)
1400-1500 Cardiac Rehab Pt up ambulating self denies any problems. Completed discharge education with pt. He agrees to [employer]. CRP in [place] , will send referral.

## 2011-08-19 NOTE — Plan of Care (Signed)
Problem: Phase I Progression Outcomes Goal: Voiding-avoid urinary catheter unless indicated Outcome: Not Applicable Date Met:  08/19/11 Pt anuric

## 2011-08-19 NOTE — Progress Notes (Signed)
Pt currently in the cath lab. Will revisit later.

## 2011-08-19 NOTE — Progress Notes (Signed)
Came back to visit pt after he's back from dialysis. Joel [name] quit smoking on 08/10/2011. He has been tobacco free since. Congratulated and encouraged pt to remain tobacco free. Discussed relapse prevention strategies. Referred to 1-800 quit now for f/u and support. Discussed oral fixation substitutes, second hand smoke and in home smoking policy. Reviewed and gave pt Written education/contact information.

## 2011-08-19 NOTE — Progress Notes (Signed)
Patient Name: Joel [name] Date of Encounter: 08/19/2011 Principal Problem:  *Pulmonary edema, acute Active Problems:  Malignant hypertension  DEGENERATIVE DISC DISEASE  Fluid overload  Pericardial effusion  Abnormal echocardiogram  ESRD (end stage renal disease)   SUBJECTIVE:Had CP during the night, relieved by Maalox, he believes it was gas/reflux. No angina. SOB improving. He is hungry.  OBJECTIVE  Filed Vitals:   08/19/11 0001 08/19/11 0006 08/19/11 0413 08/19/11 0638  BP: 143/66 147/74 165/92   Pulse: 78 75    Temp: 98.9 F (37.2 C)  98.1 F (36.7 C)   TempSrc: Oral  Oral   Resp: 12  13   Height:      Weight:    192 lb 8 oz (87.317 kg)  SpO2: 96%  99%     Intake/Output Summary (Last 24 hours) at 08/19/11 0717 Last data filed at 08/19/11 0135  Gross per 24 hour  Intake 363.48 ml  Output      0 ml  Net 363.48 ml   Weight change: 2 lb 3.9 oz (1.017 kg)  PHYSICAL EXAM  General: Well developed, well nourished, in no acute distress. Head: Normocephalic, atraumatic.  Neck: Supple without bruits, minimal JVD. Lungs:  Resp regular and unlabored, decreased breath sounds bases. Heart: RRR, S1, S2, ? S3, no S4, 2/6 murmur. Abdomen: Soft, non-tender, non-distended, BS + x 4.  Extremities: No clubbing, cyanosis, no edema. Cath site slightly tender but without bruit or hematoma Neuro: Alert and oriented X 3. Moves all extremities spontaneously. Psych: Normal affect.  LABS:  CBC: Basename 08/19/11 0415 08/17/11 1237 08/17/11 0248  WBC 4.7 5.3 --  NEUTROABS -- -- 3.6  HGB 11.9* 12.4* --  HCT 36.9* 37.2* --  MCV 87.0 85.3 --  PLT 204 219 --   INR: Basename 08/17/11 0248  INR 1.02   Basic Metabolic Panel: Basename 08/19/11 0415 08/18/11 0533  NA 137 139  K 4.5 4.6  CL 95* 97  CO2 27 28  GLUCOSE 79 90  BUN 64* 53*  CREATININE 13.03* 10.38*  CALCIUM 9.8 9.4  MG -- --  PHOS -- 6.4*   Liver Function Tests: Basename 08/18/11 0533  AST --  ALT --    ALKPHOS --  BILITOT --  PROT --  ALBUMIN 3.0*   Cardiac Enzymes: Basename 08/17/11 1518  CKTOTAL 111  CKMB 4.1*  CKMBINDEX --  TROPONINI <0.30   BNP: Pro B Natriuretic peptide (BNP)  Date/Time Value Range Status  08/17/2011  2:49 AM >70000.0* 0-125 (pg/mL) Final   Echo: Study Conclusions - Left ventricle: The cavity size was normal. Wall thickness was increased in a pattern of moderate LVH. Systolic function was mildly reduced. The estimated ejection fraction was in the range of 45% to 50%. Doppler parameters are consistent with abnormal left ventricular relaxation (grade 1 diastolic dysfunction). Doppler parameters are consistent with elevated mean left atrial filling pressure. - Regional wall motion abnormality: Hypokinesis of the entire inferior and basal-mid inferolateral myocardium. - Left atrium: The atrium was mildly dilated. - Right ventricle: The cavity size was below normal. - Systemic veins: Not visualized. - Pericardium, extracardiac: There was moderate thickening. A pericardial effusion was identified. There was chamber collapse. Respirophasic change in stroke volume was normal. Features were consistent with tamponade physiology. - Impressions: Tamponade physiology with moderate pericardial effusion predominantly antero- lateral. There is RV collapse during early diastole and RA inversion. However, there are no diagnostic respirophasic changes of either mitral or tricuspid inflow. The mean LAP appears  elevated, but no restrictive physiology of tissue doppler is seen , but the latter finding does not r/o tamponade. Apparently patient presented with pulmonary edema and tolerated hemodialysis at that time. Dr. Briant Cedar was notified and Cardiology follow- up today was recommended. The pericardial effusion is not accessible form the subxiphoid approach if pericardiocentesis is required.  TELE: SR, no significant arrhythmia       Radiology/Studies: Dg Chest Port 1  View  08/17/2011  *RADIOLOGY REPORT*  Clinical Data: Shortness of breath and chest pain.  PORTABLE CHEST - 1 VIEW  Comparison: Chest radiograph performed 11/25/2010  Findings: The lungs are well-aerated.  New vascular congestion is noted, with bilateral central airspace opacities, left greater than right.  This may reflect asymmetric pulmonary edema or possibly pneumonia.  There is a persistent chronic small left pleural effusion.  No pneumothorax is seen.  The cardiomediastinal silhouette is borderline normal in size; calcification is noted within the aortic arch.  No acute osseous abnormalities are seen.  IMPRESSION: New vascular congestion, with bilateral central airspace opacities, left greater than right.  This may reflect asymmetric pulmonary edema or possibly pneumonia.  Chronic small left pleural effusion again seen.  Original Report Authenticated By: Tonia Ghent, M.D.    Current Medications:     . aspirin  81 mg Oral Daily  . bivalirudin      . calcium acetate  2,001 mg Oral TID WC  . carvedilol  6.25 mg Oral BID WC  . carvedilol  6.25 mg Oral NOW  . cloNIDine  0.1 mg Oral Once  . clopidogrel      . clopidogrel  75 mg Oral Q breakfast  . fentaNYL      . fentaNYL      . ferric gluconate (FERRLECIT/NULECIT) IV  62.5 mg Intravenous Weekly  . heparin      . labetalol      . lanthanum  1,000 mg Oral BID WC  . lidocaine      . lisinopril  40 mg Oral Daily  . midazolam      . midazolam      . multivitamin  1 tablet Oral Daily  . nitroGLYCERIN      . nitroGLYCERIN      . pantoprazole  40 mg Oral Q1200  . paricalcitol  3 mcg Intravenous 3 times weekly  . rosuvastatin  10 mg Oral q1800  . sodium chloride  3 mL Intravenous Q12H  . sodium chloride  3 mL Intravenous Q12H    ASSESSMENT AND PLAN: 1. CAD - s/p 3.5 x 28 mm vision bare-metal stent to the RCA. No significant disease in any other vessels. Continue ASA/Plavix/BB/statin. Pt should also get Rx for SL NTG at d/c. 2.  LVD/pericardial effusion - At echo, EF 45-50 %, at cath, EF 50-55 % with normal right pressures. Continue to follow, ?secondary to pulmonary edema and/or lupus. Continue BB/ACE.  3. ESRD on HD and other issues, per primary MD.  Melida Quitter , PA-C 7:17 AM 08/19/2011

## 2011-08-19 NOTE — Progress Notes (Signed)
Patient ID: Joel [name], male   DOB: 1955-11-14, 56 y.o.   MRN: [id number] S:feels good   O: BP 141/70  Pulse 75  Temp(Src) 98.1 F (36.7 C) (Oral)  Resp 17  Ht 5\' 8"  (1.727 m)  Wt 87.317 kg (192 lb 8 oz)  BMI 29.27 kg/m2  SpO2 99%  Gen:WD WN AAM in NAD CVS:RRR Resp:CTA WUJ:[id number] Ext:no edema LUE AVF +T/B      . aspirin  81 mg Oral Daily  . bivalirudin      . calcium acetate  2,001 mg Oral TID WC  . carvedilol  6.25 mg Oral BID WC  . carvedilol  6.25 mg Oral NOW  . cloNIDine  0.1 mg Oral Once  . clopidogrel      . clopidogrel  75 mg Oral Q breakfast  . fentaNYL      . fentaNYL      . ferric gluconate (FERRLECIT/NULECIT) IV  62.5 mg Intravenous Weekly  . heparin      . labetalol      . lanthanum  1,000 mg Oral BID WC  . lidocaine      . lisinopril  40 mg Oral Daily  . midazolam      . midazolam      . multivitamin  1 tablet Oral Daily  . nitroGLYCERIN      . nitroGLYCERIN      . pantoprazole  40 mg Oral Q1200  . paricalcitol  3 mcg Intravenous 3 times weekly  . rosuvastatin  10 mg Oral q1800  . sodium chloride  3 mL Intravenous Q12H  . sodium chloride  3 mL Intravenous Q12H  . DISCONTD: aspirin  81 mg Oral Daily  . DISCONTD: heparin  5,000 Units Subcutaneous Q8H    BMET  Lab 08/19/11 0415 08/18/11 0533 08/17/11 1237 08/17/11 0301 08/17/11 0248  NA 137 139 -- 142 142  K 4.5 4.6 -- 4.8 4.9  CL 95* 97 -- 102 95*  CO2 27 28 -- -- 32  GLUCOSE 79 90 -- 74 78  BUN 64* 53* -- 75* 74*  CREATININE 13.03* 10.38* 7.69* 11.10* 12.91*  ALB -- -- -- -- --  CALCIUM 9.8 9.4 -- -- 9.5  PHOS -- 6.4* -- -- --   CBC  Lab 08/19/11 0415 08/17/11 1237 08/17/11 0301 08/17/11 0248  WBC 4.7 5.3 -- 6.1  NEUTROABS -- -- -- 3.6  HGB 11.9* 12.4* 12.6* 11.3*  HCT 36.9* 37.2* 37.0* 35.3*  MCV 87.0 85.3 -- 87.8  PLT 204 219 -- 213    @IMGRELPRIORS @  Assessment/Plan: 1.CAD s/p PTCA of RCA with bare metal stent.  plavix for 1-3 months and f/u with cards 2.ESRD cont with  HD qTTS 3. Anemia: stable 4. Pericardial effusion- right heart cath w/o evidence of tamponade 5. Nutrition: stable 6. Hypertension: will decrease EDW Madisyn Mawhinney A

## 2011-08-19 NOTE — Progress Notes (Signed)
Contacted Dr. Margo Aye regarding pt complaining of 5/10 chest pressure that travels from his chest to his stomach and back.  Obtained an EKG and it was interpreted by myself and 2 other nurses. Gave pt 1 sublingual NTG; BP 165/92 HR 73.  5 min later pt said pressure was 4/10; BP 152/92 HR 85. Dr. Margo Aye then came up to the floor and compared EKG with previous one.  The concern was that we thought we saw ST elevation, but Dr. Margo Aye analyzed it and said that it was unchanged from the previous EKG.  Orders were received to start Maalox since the pt was sure it was gas pressure/indigestion.  Will continue to assess.

## 2011-08-19 NOTE — Progress Notes (Signed)
Pt is doing well.  No angina overnight.  Had PCI of RCA yesterday  Continue current meds.  Vesta Mixer, Montez Hageman., MD, [facility] 08/19/2011, 9:12 AM

## 2011-08-20 ENCOUNTER — Other Ambulatory Visit: Payer: Self-pay

## 2011-08-20 DIAGNOSIS — R079 Chest pain, unspecified: Secondary | ICD-10-CM

## 2011-08-20 DIAGNOSIS — I2510 Atherosclerotic heart disease of native coronary artery without angina pectoris: Secondary | ICD-10-CM

## 2011-08-20 MED ORDER — CARVEDILOL 12.5 MG PO TABS
12.5000 mg | ORAL_TABLET | Freq: Two times a day (BID) | ORAL | Status: DC
  Administered 2011-08-20: 12.5 mg via ORAL
  Filled 2011-08-20 (×3): qty 1

## 2011-08-20 MED ORDER — HEPARIN SODIUM (PORCINE) 1000 UNIT/ML DIALYSIS
100.0000 [IU]/kg | INTRAMUSCULAR | Status: DC | PRN
  Filled 2011-08-20: qty 9

## 2011-08-20 NOTE — Progress Notes (Signed)
CARDIAC REHAB PHASE I   PRE:  Rate/Rhythm: 73 SR    BP: sitting 157/80    SaO2:   MODE:  Ambulation: 450 ft   POST:  Rate/Rhythm: 90 SR    BP: sitting 158/97     SaO2:   Pt c/o sore groin. Heavy limp upon standing. Improved with ambulation although still limping some at end of walk. Sts feels some better. BP up. Got meds at 1100. Will f/u. No questions. [id number]  Harriet Masson CES, ACSM

## 2011-08-20 NOTE — Progress Notes (Addendum)
Patient Name: Joel [name] Date of Encounter: 08/20/2011     Principal Problem:  *Pulmonary edema, acute Active Problems:  Malignant hypertension  DEGENERATIVE DISC DISEASE  Fluid overload  Pericardial effusion  Abnormal echocardiogram  ESRD (end stage renal disease)  CAD (coronary artery disease), native coronary artery    SUBJECTIVE:  No chest pain or sob.   CURRENT MEDS    . acetaminophen      . aspirin  81 mg Oral Daily  . calcium acetate  2,001 mg Oral TID WC  . carvedilol  6.25 mg Oral BID WC  . clopidogrel  75 mg Oral Q breakfast  . ferric gluconate (FERRLECIT/NULECIT) IV  62.5 mg Intravenous Weekly  . lanthanum  1,000 mg Oral BID WC  . lisinopril  40 mg Oral Daily  . multivitamin  1 tablet Oral Daily  . pantoprazole  40 mg Oral Q1200  . paricalcitol      . paricalcitol  3 mcg Intravenous 3 times weekly  . rosuvastatin  10 mg Oral q1800  . sodium chloride  3 mL Intravenous Q12H  . sodium chloride  3 mL Intravenous Q12H    OBJECTIVE  Filed Vitals:   08/19/11 1922 08/20/11 0000 08/20/11 0500 08/20/11 0809  BP: 143/79 125/79 147/84 144/77  Pulse: 82 80 80 77  Temp: 99.5 F (37.5 C) 98.4 F (36.9 C) 98.7 F (37.1 C) 98.4 F (36.9 C)  TempSrc: Oral Oral Oral Oral  Resp: 16 16 14 14   Height:      Weight:      SpO2: 99% 98% 99% 99%    Intake/Output Summary (Last 24 hours) at 08/20/11 1042 Last data filed at 08/19/11 2230  Gross per 24 hour  Intake    246 ml  Output   4121 ml  Net  -3875 ml   Weight change: 2 lb 13.2 oz (1.283 kg)  PHYSICAL EXAM  General: Well developed, well nourished, in no acute distress. Head: Normocephalic, atraumatic, sclera non-icteric, no xanthomas, nares are without discharge.  Neck: Supple without bruits or JVD. Lungs:  Resp regular and unlabored, CTA. Heart: RRR no s3, s4.  Audible dialysis graft bruit across chest. Abdomen: Soft, non-tender, non-distended, BS + x 4.  Msk:  Strength and tone appears normal for  age. Extremities: No clubbing, cyanosis or edema. DP/PT/Radials 2+ and equal bilaterally. Neuro: Alert and oriented X 3. Moves all extremities spontaneously. Psych: Normal affect.  LABS:  CBC:  Basename 08/19/11 0415 08/17/11 1237  WBC 4.7 5.3  NEUTROABS -- --  HGB 11.9* 12.4*  HCT 36.9* 37.2*  MCV 87.0 85.3  PLT 204 219   Basic Metabolic Panel:  Basename 08/19/11 0820 08/19/11 0415 08/18/11 0533  NA -- 137 139  K -- 4.5 4.6  CL -- 95* 97  CO2 -- 27 28  GLUCOSE -- 79 90  BUN -- 64* 53*  CREATININE -- 13.03* 10.38*  CALCIUM -- 9.8 9.4  MG -- -- --  PHOS 7.1* -- 6.4*   Liver Function Tests:  Basename 08/19/11 0820 08/18/11 0533  AST -- --  ALT -- --  ALKPHOS -- --  BILITOT -- --  PROT -- --  ALBUMIN 3.1* 3.0*   Cardiac Enzymes:  Basename 08/19/11 0820 08/17/11 1518  CKTOTAL 76 111  CKMB 3.9 4.1*  CKMBINDEX -- --  TROPONINI <0.30 <0.30    TELE rsr.  ASSESSMENT AND PLAN:  1.  Usa/CAD:  S/p bms-rca on 1/23.  No recurrent c/p, dyspnea.  Cont  asa, plavix, statin.  Will arrange outpt f/u.  2.  Mod Pericardial Effusion:  No evidence of tamponade by r heart cath.  Asymptomatic.  F/u echo as outpt.  3.  Esrd:  tts dialysis.  Per renal.    4.  Volume overload:  Volume looks good.  Mgmt in dialysis.  5.  Htn:  bp still trending high.  Push bb.  6.  tob abuse:  Cess advised.  Says he has quit.  7.  Lipids:  Check.  Cont statin.   Signed, Nicolasa Ducking NP Patient seen and examined. I agree with the assessment and plan as detailed above. See also my additional thoughts below.   Patient is stable. He had right ventricular collapse on 2-D echo. His right heart cath showed no significant abnormalities. He is stable from the cardiac viewpoint. Willa Rough, MD, [facility] 08/20/2011 11:20 AM

## 2011-08-20 NOTE — Progress Notes (Signed)
Patient ID: Joel [name], male   DOB: 16-Aug-1955, 56 y.o.   MRN: [id number] S:Pt feels well   O: BP 144/77  Pulse 77  Temp(Src) 98.4 F (36.9 C) (Oral)  Resp 14  Ht 5\' 8"  (1.727 m)  Wt 84 kg (185 lb 3 oz)  BMI 28.16 kg/m2  SpO2 99%  Gen:WD WN AAM in NAD CVS:RRR, no murmers but does have flow murmer from AVF Resp:CTA WUJ:WJXBJY Ext:no edema, AVF+T/B      . acetaminophen      . aspirin  81 mg Oral Daily  . calcium acetate  2,001 mg Oral TID WC  . carvedilol  6.25 mg Oral BID WC  . clopidogrel  75 mg Oral Q breakfast  . ferric gluconate (FERRLECIT/NULECIT) IV  62.5 mg Intravenous Weekly  . lanthanum  1,000 mg Oral BID WC  . lisinopril  40 mg Oral Daily  . multivitamin  1 tablet Oral Daily  . pantoprazole  40 mg Oral Q1200  . paricalcitol      . paricalcitol  3 mcg Intravenous 3 times weekly  . rosuvastatin  10 mg Oral q1800  . sodium chloride  3 mL Intravenous Q12H  . sodium chloride  3 mL Intravenous Q12H    BMET  Lab 08/19/11 0820 08/19/11 0415 08/18/11 0533 08/17/11 1237 08/17/11 0301 08/17/11 0248  NA -- 137 139 -- 142 142  K -- 4.5 4.6 -- 4.8 4.9  CL -- 95* 97 -- 102 95*  CO2 -- 27 28 -- -- 32  GLUCOSE -- 79 90 -- 74 78  BUN -- 64* 53* -- 75* 74*  CREATININE -- 13.03* 10.38* 7.69* 11.10* 12.91*  ALB -- -- -- -- -- --  CALCIUM -- 9.8 9.4 -- -- 9.5  PHOS 7.1* -- 6.4* -- -- --   CBC  Lab 08/19/11 0415 08/17/11 1237 08/17/11 0301 08/17/11 0248  WBC 4.7 5.3 -- 6.1  NEUTROABS -- -- -- 3.6  HGB 11.9* 12.4* 12.6* 11.3*  HCT 36.9* 37.2* 37.0* 35.3*  MCV 87.0 85.3 -- 87.8  PLT 204 219 -- 213    @IMGRELPRIORS @  Assessment/Plan: 1. CAD s/p PTCA of RCA with bare metal stent. plavix for 1-3 months and f/u with cards  2. ESRD cont with HD qTTS  3. Anemia: stable  4. Pericardial effusion- right heart cath w/o evidence of tamponade  5. Nutrition: stable  6. Hypertension: will decrease EDW  Loneta Tamplin A

## 2011-08-20 NOTE — Progress Notes (Signed)
CSW met with patient to discuss his transportation problems. Patient reported that he needs help getting transportation to PT in [place]. Patient reported that he already has transportation set up and just needs to get his PT schedule.Clinical Social Worker will sign off for now as social work intervention is no longer needed. Please consult [place] again if new need arises.

## 2011-08-21 ENCOUNTER — Inpatient Hospital Stay (HOSPITAL_COMMUNITY): Payer: Medicaid Other

## 2011-08-21 LAB — HEPATITIS B SURFACE ANTIGEN: Hepatitis B Surface Ag: NEGATIVE

## 2011-08-21 MED ORDER — CLOPIDOGREL BISULFATE 75 MG PO TABS: 75.0000 mg | ORAL_TABLET | Freq: Every day | ORAL | Status: DC

## 2011-08-21 MED ORDER — NITROGLYCERIN 0.3 MG SL SUBL: 0.3000 mg | SUBLINGUAL_TABLET | SUBLINGUAL | Status: AC | PRN

## 2011-08-21 MED ORDER — ACETAMINOPHEN 325 MG PO TABS
ORAL_TABLET | ORAL | Status: AC
  Administered 2011-08-21: 650 mg via ORAL
  Filled 2011-08-21: qty 2

## 2011-08-21 MED ORDER — PARICALCITOL 5 MCG/ML IV SOLN
INTRAVENOUS | Status: AC
  Administered 2011-08-21: 3 ug via INTRAVENOUS
  Filled 2011-08-21: qty 1

## 2011-08-21 MED ORDER — ASPIRIN 81 MG PO TABS: 81.0000 mg | ORAL_TABLET | Freq: Every day | ORAL | Status: AC

## 2011-08-21 MED ORDER — PARICALCITOL 5 MCG/ML IV SOLN: 3.0000 ug | INTRAVENOUS | Status: DC

## 2011-08-21 MED ORDER — ROSUVASTATIN CALCIUM 10 MG PO TABS: 10.0000 mg | ORAL_TABLET | Freq: Every day | ORAL | Status: DC

## 2011-08-21 MED ORDER — RENA-VITE PO TABS: 1.0000 | ORAL_TABLET | Freq: Every day | ORAL | Status: DC

## 2011-08-21 MED ORDER — CARVEDILOL 12.5 MG PO TABS: 12.5000 mg | ORAL_TABLET | Freq: Two times a day (BID) | ORAL | Status: AC

## 2011-08-21 MED ORDER — FOSINOPRIL SODIUM 40 MG PO TABS: 40.0000 mg | ORAL_TABLET | Freq: Every day | ORAL | Status: DC

## 2011-08-21 NOTE — Discharge Summary (Addendum)
Physician Discharge Summary  Patient ID: Joel [name] MRN: [id number] DOB/AGE: October 01, 1955 56 y.o.  Admit date: 08/17/2011 Discharge date: 08/21/2011  Admission Diagnoses: Shortness of breath due to pulmonary edema.  Discharge Diagnoses:  Principal Problem:  *Pulmonary edema, acute Active Problems:  Malignant hypertension  DEGENERATIVE DISC DISEASE  Fluid overload  Pericardial effusion  Abnormal echocardiogram  ESRD (end stage renal disease)  CAD (coronary artery disease), native coronary artery   Discharged Condition: good  Hospital Course: 56 year old [place] American man with a past medical history of end-stage renal disease on hemodialysis (TTS schedule in [place]) who presented a week's history of progressively worsening shortness of breath and difficulty with blood pressure control. Recent decrease in his estimated dry weight was insufficient to control the symptoms. He was initially placed on NIPPV to help manage his acute shortness of breath and exam/radiological findings revealed pulmonary edema. He underwent hemodialysis as well as a cardiology evaluation. 2-D echocardiogram showed pericardial effusion with early tamponade physiology, inferior wall hypokinesis and slightly depressed ejection fraction. The patient consequently underwent left and right heart catheterization that showed RCA stenosis which was treated with a bare-metal stent. The patient continued intensive hemodialysis with ultrafiltration and had satisfactory resolution of his admission symptoms. At the time of discharge from the hospital, he is chest pain-free and able to ambulate around his room/unit without any shortness of breath. Estimated dry weight is now 83.5 kg.  Consults: cardiology and nephrology  Significant Diagnostic Studies: labs:  BMET    Component Value Date/Time   NA 137 08/19/2011 0415   K 4.5 08/19/2011 0415   CL 95* 08/19/2011 0415   CO2 27 08/19/2011 0415   GLUCOSE 79 08/19/2011 0415   BUN 64* 08/19/2011 0415   CREATININE 13.03* 08/19/2011 0415   CALCIUM 9.8 08/19/2011 0415   CALCIUM 6.7* 01/17/2008 1725   GFRNONAA 4* 08/19/2011 0415   GFRAA 4* 08/19/2011 0415   CBC    Component Value Date/Time   WBC 4.7 08/19/2011 0415   RBC 4.24 08/19/2011 0415   HGB 11.9* 08/19/2011 0415   HCT 36.9* 08/19/2011 0415   PLT 204 08/19/2011 0415   MCV 87.0 08/19/2011 0415   MCH 28.1 08/19/2011 0415   MCHC 32.2 08/19/2011 0415   RDW 15.0 08/19/2011 0415   LYMPHSABS 1.8 08/17/2011 0248   MONOABS 0.3 08/17/2011 0248   EOSABS 0.3 08/17/2011 0248   BASOSABS 0.0 08/17/2011 0248     , radiology: CXR: pulmonary edema,   cardiac graphics: Echocardiogram: Study Conclusions - Left ventricle: The cavity size was normal. Wall thickness was increased in a pattern of moderate LVH. Systolic function was mildly reduced. The estimated ejection fraction was in the range of 45% to 50%. Doppler parameters are consistent with abnormal left ventricular relaxation (grade 1 diastolic dysfunction). Doppler parameters are consistent with elevated mean left atrial filling pressure. - Regional wall motion abnormality: Hypokinesis of the entire inferior and basal-mid inferolateral myocardium. - Left atrium: The atrium was mildly dilated. - Right ventricle: The cavity size was below normal. - Systemic veins: Not visualized. - Pericardium, extracardiac: There was moderate thickening. A pericardial effusion was identified. There was chamber collapse. Respirophasic change in stroke volume was normal. Features were consistent with tamponade physiology. - Impressions: Tamponade physiology with moderate pericardial effusion predominantly antero- lateral. There is RV collapse during early diastole and RA inversion. However, there are no diagnostic respirophasic changes of either mitral or tricuspid inflow. The mean LAP appears elevated, but no restrictive physiology of tissue doppler is  seen , but the latter finding  does not r/o tamponade. Apparently patient presented with pulmonary edema and tolerated hemodialysis at that time. Dr. Briant Cedar was notified and Cardiology follow- up today was recommended. The pericardial effusion is not accessible form the subxiphoid approach if pericardiocentesis is required. Impressions:  - Tamponade physiology with moderate pericardial effusion predominantly antero- lateral. There is RV collapse during early diastole and RA inversion. However, there are no diagnostic respirophasic changes of either mitral or tricuspid inflow. The mean LAP appears elevated, but no restrictive physiology of tissue doppler is seen , but the latter finding does not r/o tamponade. Apparently patient presented with pulmonary edema and tolerated hemodialysis at that time. Dr. Briant Cedar was notified and Cardiology follow- up today was recommended. The pericardial effusion is not accessible form the subxiphoid approach if pericardiocentesis is required.   and angiography: s/p 3.5 x 28 mm vision bare-metal stent to the RCA. No significant disease in any other vessels   Treatments: cardiac meds: fosinopril (Monopril) and carvedilol, anticoagulation: ASA and Plavix and dialysis: Hemodialysis  Discharge Exam: Blood pressure 162/86, pulse 73, temperature 98.5 F (36.9 C), temperature source Oral, resp. rate 20, height 5\' 8"  (1.727 m), weight 86.4 kg (190 lb 7.6 oz), SpO2 96.00%. General appearance: alert, cooperative and appears stated age Head: Normocephalic, without obvious abnormality, atraumatic Neck: no adenopathy, no carotid bruit, no JVD, supple, symmetrical, trachea midline and thyroid not enlarged, symmetric, no tenderness/mass/nodules Resp: clear to auscultation bilaterally Chest wall: no tenderness Cardio: regular rate and rhythm, S1, S2 normal, no murmur, click, rub or gallop GI: soft, non-tender; bowel sounds normal; no masses,  no organomegaly Extremities: extremities  normal, atraumatic, no cyanosis or edema Pulses: 2+ and symmetric  Disposition: Home or Self Care  Discharge Orders    Future Appointments: Provider: Department: Dept Phone: Center:   09/02/2011 9:30 AM Edwina Junious [facility]er [facility]  None   09/02/2011 10:30 AM Rosalio Macadamia, NP [facility] [phone] None     Medication List  As of 08/21/2011  9:38 AM   TAKE these medications         aspirin 81 MG tablet   Take 1 tablet (81 mg total) by mouth daily.      calcium acetate 667 MG capsule   Commonly known as: PHOSLO   Take 2,001 mg by mouth 3 (three) times daily with meals.      carvedilol 12.5 MG tablet   Commonly known as: COREG   Take 1 tablet (12.5 mg total) by mouth 2 (two) times daily with a meal.      clopidogrel 75 MG tablet   Commonly known as: PLAVIX   Take 1 tablet (75 mg total) by mouth daily with breakfast.      fosinopril 40 MG tablet   Commonly known as: MONOPRIL   Take 1 tablet (40 mg total) by mouth daily.      lanthanum 1000 MG chewable tablet   Commonly known as: FOSRENOL   Chew 1,000 mg by mouth 2 (two) times daily with a meal.      multivitamin Tabs tablet   Take 1 tablet by mouth daily.      nitroGLYCERIN 0.3 MG SL tablet   Commonly known as: NITROSTAT   Place 1 tablet (0.3 mg total) under the tongue every 5 (five) minutes as needed for chest pain.      omeprazole 40 MG capsule   Commonly known as: PRILOSEC   Take 40 mg by mouth  daily.      paricalcitol 5 MCG/ML injection   Commonly known as: ZEMPLAR   Inject 0.6 mLs (3 mcg total) into the vein 3 (three) times a week.      rosuvastatin 10 MG tablet   Commonly known as: CRESTOR   Take 1 tablet (10 mg total) by mouth daily at 6 PM.      traMADol-acetaminophen 37.5-325 MG per tablet   Commonly known as: ULTRACET   Take 1 tablet by mouth every 6 (six) hours as needed. For pain           Follow-up Information    Follow up with Zada Girt, MD.      Follow up with Norma Fredrickson, NP on 09/02/2011. (9:30 - echocardiogram; 10:30 clinic appt)    Contact information:   [address], [address] [place] [address] [phone]         Follow up at the [facility] for hemodialysis on a routine schedule starting Tuesday 08/24/2011.  SignedZetta Bills K. 08/21/2011, 9:38 AM

## 2011-08-21 NOTE — Procedures (Signed)
I was present at this session.  I have reviewed the session itself and made appropriate changes.  Mathew Storck K. 1/26/20138:45 AM

## 2011-08-21 NOTE — Progress Notes (Signed)
Patient ID: Joel [name], male   DOB: November 16, 1955, 56 y.o.   MRN: [id number] The patient is stable from the cardiac viewpoint. We made all of his post discharge arrangements yesterday. He can be discharged home today.

## 2011-08-21 NOTE — Progress Notes (Signed)
Patient ID: Joel [name], male   DOB: [birth year], 56 y.o.   MRN: [id number]  S: No further chest pain, able to ambulate without any shortness of breath or chest pain.   O: BP 128/69  Pulse 81  Temp(Src) 98.3 F (36.8 C) (Oral)  Resp 20  Ht 5\' 8"  (1.727 m)  Wt 85.9 kg (189 lb 6 oz)  BMI 28.79 kg/m2  SpO2 98%  Gen: Comfortably walking around in his room, preparing for dialysis.  WUJ:WJXBJ regular in rate and rhythm, heart sounds S1 and S2 are normal Resp: Lung fields clear to auscultation bilaterally, no rales/wheeze Abd: Soft, flat, nontender, bowel sounds are normal Ext: No edema of either lower extremity.      Marland Kitchen aspirin  81 mg Oral Daily  . calcium acetate  2,001 mg Oral TID WC  . carvedilol  12.5 mg Oral BID WC  . clopidogrel  75 mg Oral Q breakfast  . ferric gluconate (FERRLECIT/NULECIT) IV  62.5 mg Intravenous Weekly  . lanthanum  1,000 mg Oral BID WC  . lisinopril  40 mg Oral Daily  . multivitamin  1 tablet Oral Daily  . pantoprazole  40 mg Oral Q1200  . paricalcitol  3 mcg Intravenous 3 times weekly  . rosuvastatin  10 mg Oral q1800  . sodium chloride  3 mL Intravenous Q12H  . sodium chloride  3 mL Intravenous Q12H  . DISCONTD: carvedilol  6.25 mg Oral BID WC    BMET  Lab 08/19/11 0820 08/19/11 0415 08/18/11 0533 08/17/11 1237 08/17/11 0301 08/17/11 0248  NA -- 137 139 -- 142 142  K -- 4.5 4.6 -- 4.8 4.9  CL -- 95* 97 -- 102 95*  CO2 -- 27 28 -- -- 32  GLUCOSE -- 79 90 -- 74 78  BUN -- 64* 53* -- 75* 74*  CREATININE -- 13.03* 10.38* 7.69* 11.10* 12.91*  ALB -- -- -- -- -- --  CALCIUM -- 9.8 9.4 -- -- 9.5  PHOS 7.1* -- 6.4* -- -- --   CBC  Lab 08/19/11 0415 08/17/11 1237 08/17/11 0301 08/17/11 0248  WBC 4.7 5.3 -- 6.1  NEUTROABS -- -- -- 3.6  HGB 11.9* 12.4* 12.6* 11.3*  HCT 36.9* 37.2* 37.0* 35.3*  MCV 87.0 85.3 -- 87.8  PLT 204 219 -- 213    Assessment/Plan:  1. CAD s/p PTCA of RCA with bare metal stent. plavix for 1-3 months and f/u with cards    2. ESRD- hemodialysis today per TTS schedule. No acute volume indications noted, labs pending. 3. Anemia: stable-on intravenous iron, no ESA at this time.  4. Pericardial effusion- right heart cath w/o evidence of tamponade  5. Nutrition: stable, encourage lean protein-no indications for ONS at this time 6. Hypertension: On lisinopril and carvedilol, recent reduction of dry weight appreciated.  7. Disposition: Possible DC home today if OK with cardiology  Dorance Spink K.

## 2011-08-31 ENCOUNTER — Other Ambulatory Visit (HOSPITAL_COMMUNITY): Payer: Self-pay | Admitting: Cardiovascular Disease

## 2011-08-31 DIAGNOSIS — I313 Pericardial effusion (noninflammatory): Secondary | ICD-10-CM

## 2011-09-02 ENCOUNTER — Encounter: Payer: Medicaid Other | Admitting: Nurse Practitioner

## 2011-09-02 ENCOUNTER — Other Ambulatory Visit (HOSPITAL_COMMUNITY): Payer: Medicaid Other | Admitting: Radiology

## 2011-09-07 ENCOUNTER — Telehealth: Payer: Self-pay | Admitting: *Deleted

## 2011-09-07 NOTE — Telephone Encounter (Signed)
Wants to reschedule app to f/u and echo to be all on same day, he missed his 09/02/11 app for both. Please call pt to schedule

## 2011-09-13 ENCOUNTER — Other Ambulatory Visit (HOSPITAL_COMMUNITY): Payer: Self-pay | Admitting: Cardiology

## 2011-09-13 DIAGNOSIS — I313 Pericardial effusion (noninflammatory): Secondary | ICD-10-CM

## 2011-09-15 ENCOUNTER — Encounter: Payer: Medicaid Other | Admitting: Nurse Practitioner

## 2011-09-15 ENCOUNTER — Ambulatory Visit (HOSPITAL_COMMUNITY): Payer: Medicaid Other | Attending: Cardiovascular Disease

## 2012-05-16 IMAGING — CR DG CHEST 1V PORT
1 series · 1 of 1 positions shown · non-contrast
Comparison: Chest radiograph performed 11/25/2010

CLINICAL DATA: Shortness of breath and chest pain.

PORTABLE CHEST - 1 VIEW

[AP]
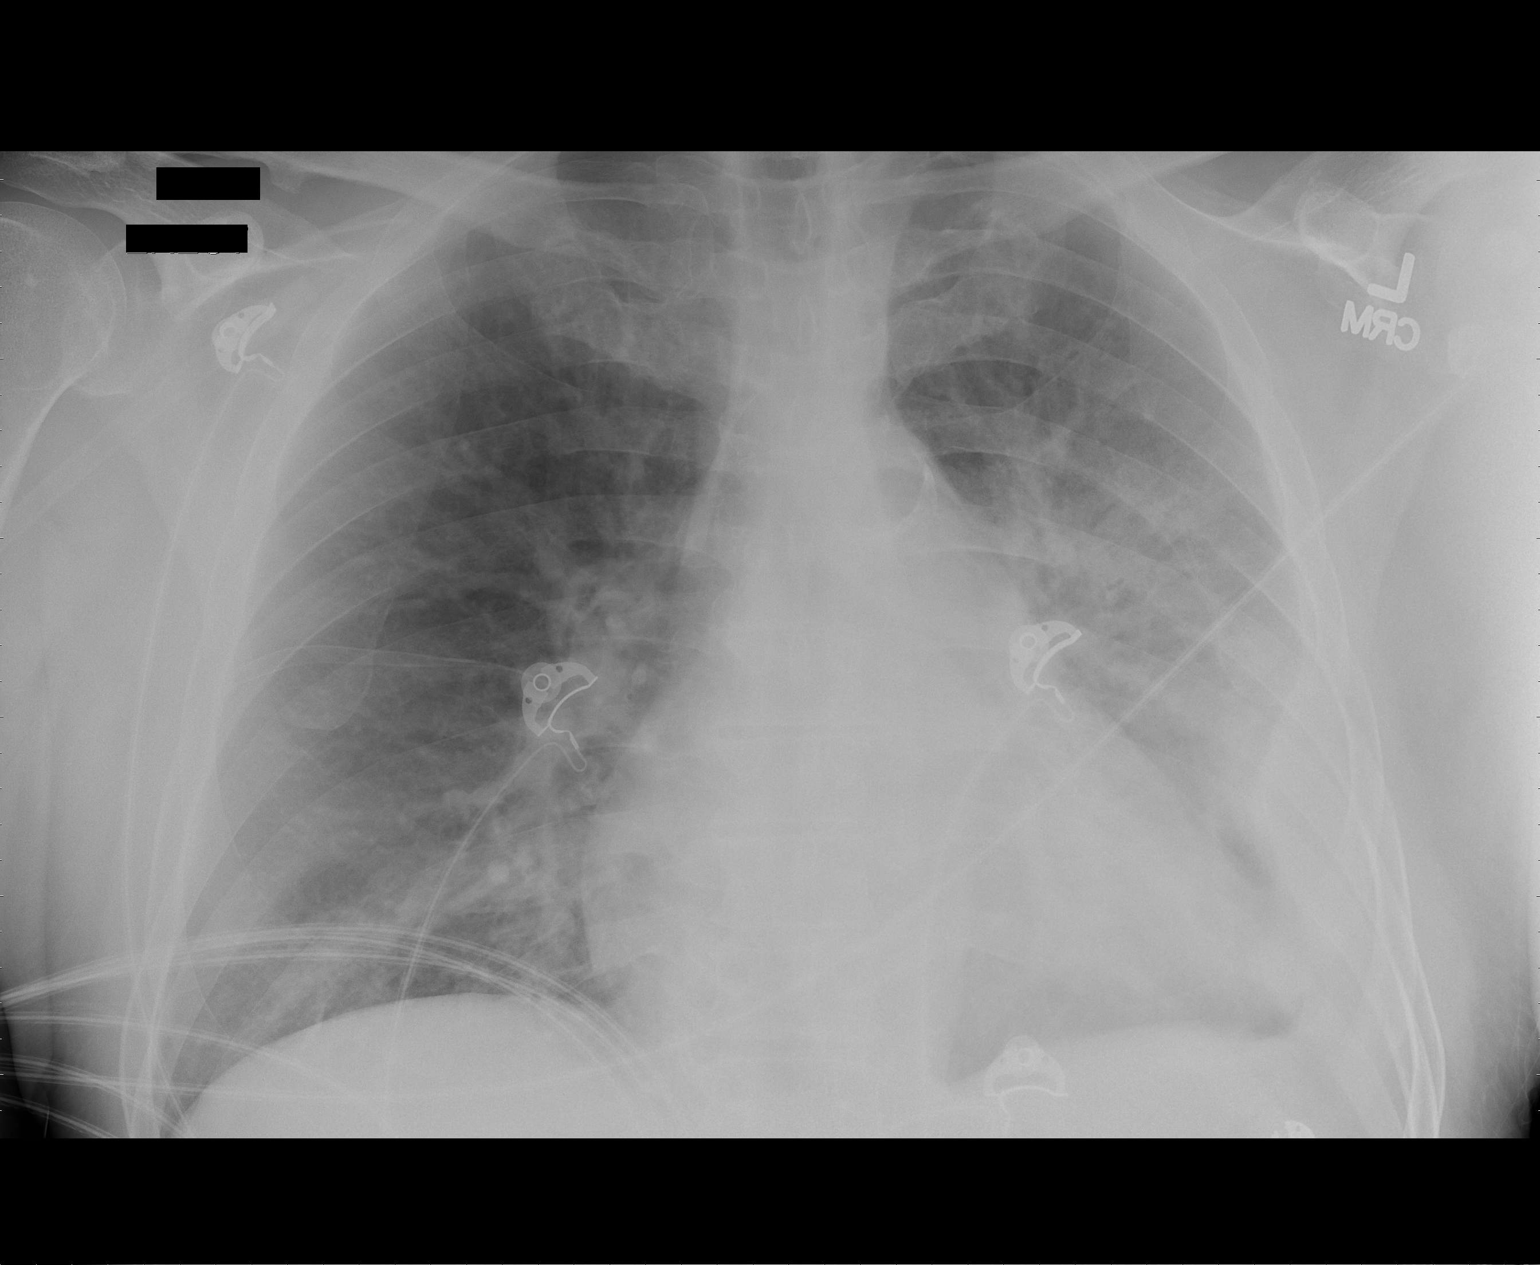

[1 of 1 positions shown; findings below may reference images not displayed]

FINDINGS: The lungs are well-aerated.  New vascular congestion is
noted, with bilateral central airspace opacities, left greater than
right.  This may reflect asymmetric pulmonary edema or possibly
pneumonia.  There is a persistent chronic small left pleural
effusion.  No pneumothorax is seen.

The cardiomediastinal silhouette is borderline normal in size;
calcification is noted within the aortic arch.  No acute osseous
abnormalities are seen.
IMPRESSION: New vascular congestion, with bilateral central airspace opacities,
left greater than right.  This may reflect asymmetric pulmonary
edema or possibly pneumonia.  Chronic small left pleural effusion
again seen.

## 2013-07-16 ENCOUNTER — Other Ambulatory Visit: Payer: Self-pay | Admitting: *Deleted

## 2013-07-16 DIAGNOSIS — T82598A Other mechanical complication of other cardiac and vascular devices and implants, initial encounter: Secondary | ICD-10-CM

## 2013-07-23 ENCOUNTER — Encounter: Payer: Self-pay | Admitting: Vascular Surgery

## 2013-07-24 ENCOUNTER — Encounter: Payer: Self-pay | Admitting: Vascular Surgery

## 2013-07-24 ENCOUNTER — Ambulatory Visit (INDEPENDENT_AMBULATORY_CARE_PROVIDER_SITE_OTHER): Payer: Medicaid Other | Admitting: Vascular Surgery

## 2013-07-24 ENCOUNTER — Encounter (INDEPENDENT_AMBULATORY_CARE_PROVIDER_SITE_OTHER): Payer: Self-pay

## 2013-07-24 ENCOUNTER — Ambulatory Visit (HOSPITAL_COMMUNITY)
Admission: RE | Admit: 2013-07-24 | Discharge: 2013-07-24 | Disposition: A | Discharge status: Home / Self Care | Payer: Medicaid Other | Source: Ambulatory Visit | Attending: Vascular Surgery | Admitting: Vascular Surgery

## 2013-07-24 VITALS — BP 139/70 | HR 66 | Ht 68.0 in | Wt 186.0 lb

## 2013-07-24 DIAGNOSIS — T82598A Other mechanical complication of other cardiac and vascular devices and implants, initial encounter: Secondary | ICD-10-CM

## 2013-07-24 DIAGNOSIS — I770 Arteriovenous fistula, acquired: Secondary | ICD-10-CM | POA: Insufficient documentation

## 2013-07-24 DIAGNOSIS — T82898A Other specified complication of vascular prosthetic devices, implants and grafts, initial encounter: Secondary | ICD-10-CM | POA: Insufficient documentation

## 2013-07-24 DIAGNOSIS — N186 End stage renal disease: Secondary | ICD-10-CM | POA: Insufficient documentation

## 2013-07-24 DIAGNOSIS — Y832 Surgical operation with anastomosis, bypass or graft as the cause of abnormal reaction of the patient, or of later complication, without mention of misadventure at the time of the procedure: Secondary | ICD-10-CM | POA: Insufficient documentation

## 2013-07-24 NOTE — Progress Notes (Signed)
Subjective:     Patient ID: Joel [name], male   DOB: 1955-10-05, 57 y.o.   MRN: [id number]  HPI this 57 year old male with end-stage renal disease was referred for possible pseudoaneurysm of left arm access. The patient had a left upper arm AV fistula which developed a pseudoaneurysm and this was treated inserting a segment of AV graft by Dr. Darrick Penna in 2012. This replaced approximately 40-50% of the fistula in the left upper arm. Official is functioning nicely. There is one area which has become more prominent. There's been no history of bleeding, infection, skin breakdown, or other problems. Patient has no pain or numbness in the left hand.   Past Medical History  Diagnosis Date  . ESRD (end stage renal disease)   . HTN (hypertension)   . Hyperparathyroidism   . DDD (degenerative disc disease)   . Pneumonia   . Headache(784.0)   . CAD (coronary artery disease), native coronary artery     BMS RCA January 2013    History  Substance Use Topics  . Smoking status: Former Smoker -- 0.30 packs/day for 25 years    Quit date: 08/10/2011  . Smokeless tobacco: Never Used  . Alcohol Use: No    History reviewed. No pertinent family history.  Allergies  Allergen Reactions  . Aspirin     REACTION: unable to take d/t kidney dx    Current outpatient prescriptions:calcium acetate (PHOSLO) 667 MG capsule, Take 2,001 mg by mouth 3 (three) times daily with meals.  , Disp: , Rfl: ;  fosinopril (MONOPRIL) 40 MG tablet, Take 1 tablet (40 mg total) by mouth daily., Disp: 30 tablet, Rfl: 11;  multivitamin (RENA-VIT) TABS tablet, Take 1 tablet by mouth daily., Disp: 90 tablet, Rfl: 3 paricalcitol (ZEMPLAR) 5 MCG/ML injection, Inject 0.6 mLs (3 mcg total) into the vein 3 (three) times a week., Disp: 1 mL, Rfl: ;  aspirin 81 MG tablet, Take 1 tablet (81 mg total) by mouth daily., Disp: 30 tablet, Rfl: ;  clopidogrel (PLAVIX) 75 MG tablet, Take 1 tablet (75 mg total) by mouth daily with breakfast., Disp: 30  tablet, Rfl: 9 lanthanum (FOSRENOL) 1000 MG chewable tablet, Chew 1,000 mg by mouth 2 (two) times daily with a meal., Disp: , Rfl: ;  nitroGLYCERIN (NITROSTAT) 0.3 MG SL tablet, Place 1 tablet (0.3 mg total) under the tongue every 5 (five) minutes as needed for chest pain., Disp: 90 tablet, Rfl: 3;  omeprazole (PRILOSEC) 40 MG capsule, Take 40 mg by mouth daily., Disp: , Rfl:  rosuvastatin (CRESTOR) 10 MG tablet, Take 1 tablet (10 mg total) by mouth daily at 6 PM., Disp: 30 tablet, Rfl: 6;  traMADol-acetaminophen (ULTRACET) 37.5-325 MG per tablet, Take 1 tablet by mouth every 6 (six) hours as needed. For pain, Disp: , Rfl:   BP 139/70  Pulse 66  Ht 5\' 8"  (1.727 m)  Wt 186 lb (84.369 kg)  BMI 28.29 kg/m2  SpO2 100%  Body mass index is 28.29 kg/(m^2).           Review of Systems     Objective:   Physical Exam general well-developed well-nourished male no apparent stress alert and oriented x3 Lungs no rhonchi or wheezing Left upper extremity the functioning access in left upper arm. There is an AV graft involving the distal 40% which is attached to an AV fistula with an excellent pulse and thrill in the proximal upper arm. 2+ radial pulses palpable. There is one area of dilatation involving the graft which  is slightly dilated. There is no skin breakdown, erythema, eschar, or evidence of infection. This measures approximately 2 cm in diameter.    Assessment:     Patient with end-stage renal disease with combination of AV graft an AV fistula left upper arm a small pseudoaneurysm bulging the graft with no evidence of bleeding or infection or skin breakdown    Plan:     Do not believe this needs to be treated at the present time. If it enlarges or if there is any evidence of bleeding or skin breakdown and certainly more replacement with Gore-Tex will need to be performed. Patient is in agreement with this plan. He will keep a close eye on this and notify us if there are any significant  changes.

## 2014-02-22 DIAGNOSIS — N186 End stage renal disease: Secondary | ICD-10-CM | POA: Diagnosis not present

## 2014-03-25 DIAGNOSIS — N186 End stage renal disease: Secondary | ICD-10-CM | POA: Diagnosis not present

## 2014-04-24 DIAGNOSIS — N186 End stage renal disease: Secondary | ICD-10-CM | POA: Diagnosis not present

## 2014-04-25 DIAGNOSIS — D509 Iron deficiency anemia, unspecified: Secondary | ICD-10-CM | POA: Diagnosis not present

## 2014-04-25 DIAGNOSIS — D631 Anemia in chronic kidney disease: Secondary | ICD-10-CM | POA: Diagnosis not present

## 2014-04-25 DIAGNOSIS — Z23 Encounter for immunization: Secondary | ICD-10-CM | POA: Diagnosis not present

## 2014-04-25 DIAGNOSIS — N186 End stage renal disease: Secondary | ICD-10-CM | POA: Diagnosis not present

## 2014-04-25 DIAGNOSIS — N2581 Secondary hyperparathyroidism of renal origin: Secondary | ICD-10-CM | POA: Diagnosis not present

## 2014-04-27 DIAGNOSIS — D509 Iron deficiency anemia, unspecified: Secondary | ICD-10-CM | POA: Diagnosis not present

## 2014-04-27 DIAGNOSIS — N186 End stage renal disease: Secondary | ICD-10-CM | POA: Diagnosis not present

## 2014-04-27 DIAGNOSIS — D631 Anemia in chronic kidney disease: Secondary | ICD-10-CM | POA: Diagnosis not present

## 2014-04-27 DIAGNOSIS — N2581 Secondary hyperparathyroidism of renal origin: Secondary | ICD-10-CM | POA: Diagnosis not present

## 2014-04-27 DIAGNOSIS — Z23 Encounter for immunization: Secondary | ICD-10-CM | POA: Diagnosis not present

## 2014-04-30 DIAGNOSIS — N186 End stage renal disease: Secondary | ICD-10-CM | POA: Diagnosis not present

## 2014-04-30 DIAGNOSIS — Z23 Encounter for immunization: Secondary | ICD-10-CM | POA: Diagnosis not present

## 2014-04-30 DIAGNOSIS — N2581 Secondary hyperparathyroidism of renal origin: Secondary | ICD-10-CM | POA: Diagnosis not present

## 2014-04-30 DIAGNOSIS — D509 Iron deficiency anemia, unspecified: Secondary | ICD-10-CM | POA: Diagnosis not present

## 2014-04-30 DIAGNOSIS — D631 Anemia in chronic kidney disease: Secondary | ICD-10-CM | POA: Diagnosis not present

## 2014-05-02 DIAGNOSIS — Z23 Encounter for immunization: Secondary | ICD-10-CM | POA: Diagnosis not present

## 2014-05-02 DIAGNOSIS — N186 End stage renal disease: Secondary | ICD-10-CM | POA: Diagnosis not present

## 2014-05-02 DIAGNOSIS — D631 Anemia in chronic kidney disease: Secondary | ICD-10-CM | POA: Diagnosis not present

## 2014-05-02 DIAGNOSIS — N2581 Secondary hyperparathyroidism of renal origin: Secondary | ICD-10-CM | POA: Diagnosis not present

## 2014-05-02 DIAGNOSIS — D509 Iron deficiency anemia, unspecified: Secondary | ICD-10-CM | POA: Diagnosis not present

## 2014-05-04 DIAGNOSIS — Z23 Encounter for immunization: Secondary | ICD-10-CM | POA: Diagnosis not present

## 2014-05-04 DIAGNOSIS — N186 End stage renal disease: Secondary | ICD-10-CM | POA: Diagnosis not present

## 2014-05-04 DIAGNOSIS — D509 Iron deficiency anemia, unspecified: Secondary | ICD-10-CM | POA: Diagnosis not present

## 2014-05-04 DIAGNOSIS — D631 Anemia in chronic kidney disease: Secondary | ICD-10-CM | POA: Diagnosis not present

## 2014-05-04 DIAGNOSIS — N2581 Secondary hyperparathyroidism of renal origin: Secondary | ICD-10-CM | POA: Diagnosis not present

## 2014-05-07 DIAGNOSIS — D631 Anemia in chronic kidney disease: Secondary | ICD-10-CM | POA: Diagnosis not present

## 2014-05-07 DIAGNOSIS — N2581 Secondary hyperparathyroidism of renal origin: Secondary | ICD-10-CM | POA: Diagnosis not present

## 2014-05-07 DIAGNOSIS — N186 End stage renal disease: Secondary | ICD-10-CM | POA: Diagnosis not present

## 2014-05-07 DIAGNOSIS — D509 Iron deficiency anemia, unspecified: Secondary | ICD-10-CM | POA: Diagnosis not present

## 2014-05-07 DIAGNOSIS — Z23 Encounter for immunization: Secondary | ICD-10-CM | POA: Diagnosis not present

## 2014-05-09 DIAGNOSIS — Z23 Encounter for immunization: Secondary | ICD-10-CM | POA: Diagnosis not present

## 2014-05-09 DIAGNOSIS — N2581 Secondary hyperparathyroidism of renal origin: Secondary | ICD-10-CM | POA: Diagnosis not present

## 2014-05-09 DIAGNOSIS — D631 Anemia in chronic kidney disease: Secondary | ICD-10-CM | POA: Diagnosis not present

## 2014-05-09 DIAGNOSIS — D509 Iron deficiency anemia, unspecified: Secondary | ICD-10-CM | POA: Diagnosis not present

## 2014-05-09 DIAGNOSIS — N186 End stage renal disease: Secondary | ICD-10-CM | POA: Diagnosis not present

## 2014-05-11 DIAGNOSIS — Z23 Encounter for immunization: Secondary | ICD-10-CM | POA: Diagnosis not present

## 2014-05-11 DIAGNOSIS — D631 Anemia in chronic kidney disease: Secondary | ICD-10-CM | POA: Diagnosis not present

## 2014-05-11 DIAGNOSIS — N186 End stage renal disease: Secondary | ICD-10-CM | POA: Diagnosis not present

## 2014-05-11 DIAGNOSIS — D509 Iron deficiency anemia, unspecified: Secondary | ICD-10-CM | POA: Diagnosis not present

## 2014-05-11 DIAGNOSIS — N2581 Secondary hyperparathyroidism of renal origin: Secondary | ICD-10-CM | POA: Diagnosis not present

## 2014-05-14 DIAGNOSIS — D509 Iron deficiency anemia, unspecified: Secondary | ICD-10-CM | POA: Diagnosis not present

## 2014-05-14 DIAGNOSIS — D631 Anemia in chronic kidney disease: Secondary | ICD-10-CM | POA: Diagnosis not present

## 2014-05-14 DIAGNOSIS — N2581 Secondary hyperparathyroidism of renal origin: Secondary | ICD-10-CM | POA: Diagnosis not present

## 2014-05-14 DIAGNOSIS — N186 End stage renal disease: Secondary | ICD-10-CM | POA: Diagnosis not present

## 2014-05-14 DIAGNOSIS — Z23 Encounter for immunization: Secondary | ICD-10-CM | POA: Diagnosis not present

## 2014-05-16 DIAGNOSIS — Z23 Encounter for immunization: Secondary | ICD-10-CM | POA: Diagnosis not present

## 2014-05-16 DIAGNOSIS — D509 Iron deficiency anemia, unspecified: Secondary | ICD-10-CM | POA: Diagnosis not present

## 2014-05-16 DIAGNOSIS — N2581 Secondary hyperparathyroidism of renal origin: Secondary | ICD-10-CM | POA: Diagnosis not present

## 2014-05-16 DIAGNOSIS — N186 End stage renal disease: Secondary | ICD-10-CM | POA: Diagnosis not present

## 2014-05-16 DIAGNOSIS — D631 Anemia in chronic kidney disease: Secondary | ICD-10-CM | POA: Diagnosis not present

## 2014-05-18 DIAGNOSIS — Z23 Encounter for immunization: Secondary | ICD-10-CM | POA: Diagnosis not present

## 2014-05-18 DIAGNOSIS — D509 Iron deficiency anemia, unspecified: Secondary | ICD-10-CM | POA: Diagnosis not present

## 2014-05-18 DIAGNOSIS — D631 Anemia in chronic kidney disease: Secondary | ICD-10-CM | POA: Diagnosis not present

## 2014-05-18 DIAGNOSIS — N186 End stage renal disease: Secondary | ICD-10-CM | POA: Diagnosis not present

## 2014-05-18 DIAGNOSIS — N2581 Secondary hyperparathyroidism of renal origin: Secondary | ICD-10-CM | POA: Diagnosis not present

## 2014-05-21 DIAGNOSIS — D509 Iron deficiency anemia, unspecified: Secondary | ICD-10-CM | POA: Diagnosis not present

## 2014-05-21 DIAGNOSIS — D631 Anemia in chronic kidney disease: Secondary | ICD-10-CM | POA: Diagnosis not present

## 2014-05-21 DIAGNOSIS — N186 End stage renal disease: Secondary | ICD-10-CM | POA: Diagnosis not present

## 2014-05-21 DIAGNOSIS — N2581 Secondary hyperparathyroidism of renal origin: Secondary | ICD-10-CM | POA: Diagnosis not present

## 2014-05-21 DIAGNOSIS — Z23 Encounter for immunization: Secondary | ICD-10-CM | POA: Diagnosis not present

## 2014-05-23 DIAGNOSIS — Z23 Encounter for immunization: Secondary | ICD-10-CM | POA: Diagnosis not present

## 2014-05-23 DIAGNOSIS — D509 Iron deficiency anemia, unspecified: Secondary | ICD-10-CM | POA: Diagnosis not present

## 2014-05-23 DIAGNOSIS — N186 End stage renal disease: Secondary | ICD-10-CM | POA: Diagnosis not present

## 2014-05-23 DIAGNOSIS — D631 Anemia in chronic kidney disease: Secondary | ICD-10-CM | POA: Diagnosis not present

## 2014-05-23 DIAGNOSIS — N2581 Secondary hyperparathyroidism of renal origin: Secondary | ICD-10-CM | POA: Diagnosis not present

## 2014-05-25 DIAGNOSIS — Z23 Encounter for immunization: Secondary | ICD-10-CM | POA: Diagnosis not present

## 2014-05-25 DIAGNOSIS — D509 Iron deficiency anemia, unspecified: Secondary | ICD-10-CM | POA: Diagnosis not present

## 2014-05-25 DIAGNOSIS — D631 Anemia in chronic kidney disease: Secondary | ICD-10-CM | POA: Diagnosis not present

## 2014-05-25 DIAGNOSIS — N186 End stage renal disease: Secondary | ICD-10-CM | POA: Diagnosis not present

## 2014-05-25 DIAGNOSIS — Z992 Dependence on renal dialysis: Secondary | ICD-10-CM | POA: Diagnosis not present

## 2014-05-25 DIAGNOSIS — N2581 Secondary hyperparathyroidism of renal origin: Secondary | ICD-10-CM | POA: Diagnosis not present

## 2014-05-28 DIAGNOSIS — D631 Anemia in chronic kidney disease: Secondary | ICD-10-CM | POA: Diagnosis not present

## 2014-05-28 DIAGNOSIS — N2581 Secondary hyperparathyroidism of renal origin: Secondary | ICD-10-CM | POA: Diagnosis not present

## 2014-05-28 DIAGNOSIS — D509 Iron deficiency anemia, unspecified: Secondary | ICD-10-CM | POA: Diagnosis not present

## 2014-05-28 DIAGNOSIS — N186 End stage renal disease: Secondary | ICD-10-CM | POA: Diagnosis not present

## 2014-05-30 DIAGNOSIS — N186 End stage renal disease: Secondary | ICD-10-CM | POA: Diagnosis not present

## 2014-05-30 DIAGNOSIS — D631 Anemia in chronic kidney disease: Secondary | ICD-10-CM | POA: Diagnosis not present

## 2014-05-30 DIAGNOSIS — H3532 Exudative age-related macular degeneration: Secondary | ICD-10-CM | POA: Diagnosis not present

## 2014-05-30 DIAGNOSIS — D509 Iron deficiency anemia, unspecified: Secondary | ICD-10-CM | POA: Diagnosis not present

## 2014-05-30 DIAGNOSIS — N2581 Secondary hyperparathyroidism of renal origin: Secondary | ICD-10-CM | POA: Diagnosis not present

## 2014-06-01 DIAGNOSIS — D631 Anemia in chronic kidney disease: Secondary | ICD-10-CM | POA: Diagnosis not present

## 2014-06-01 DIAGNOSIS — N2581 Secondary hyperparathyroidism of renal origin: Secondary | ICD-10-CM | POA: Diagnosis not present

## 2014-06-01 DIAGNOSIS — N186 End stage renal disease: Secondary | ICD-10-CM | POA: Diagnosis not present

## 2014-06-01 DIAGNOSIS — D509 Iron deficiency anemia, unspecified: Secondary | ICD-10-CM | POA: Diagnosis not present

## 2014-06-04 DIAGNOSIS — N2581 Secondary hyperparathyroidism of renal origin: Secondary | ICD-10-CM | POA: Diagnosis not present

## 2014-06-04 DIAGNOSIS — N186 End stage renal disease: Secondary | ICD-10-CM | POA: Diagnosis not present

## 2014-06-04 DIAGNOSIS — D631 Anemia in chronic kidney disease: Secondary | ICD-10-CM | POA: Diagnosis not present

## 2014-06-04 DIAGNOSIS — D509 Iron deficiency anemia, unspecified: Secondary | ICD-10-CM | POA: Diagnosis not present

## 2014-06-06 DIAGNOSIS — D631 Anemia in chronic kidney disease: Secondary | ICD-10-CM | POA: Diagnosis not present

## 2014-06-06 DIAGNOSIS — N186 End stage renal disease: Secondary | ICD-10-CM | POA: Diagnosis not present

## 2014-06-06 DIAGNOSIS — D509 Iron deficiency anemia, unspecified: Secondary | ICD-10-CM | POA: Diagnosis not present

## 2014-06-06 DIAGNOSIS — N2581 Secondary hyperparathyroidism of renal origin: Secondary | ICD-10-CM | POA: Diagnosis not present

## 2014-06-08 DIAGNOSIS — N186 End stage renal disease: Secondary | ICD-10-CM | POA: Diagnosis not present

## 2014-06-08 DIAGNOSIS — D509 Iron deficiency anemia, unspecified: Secondary | ICD-10-CM | POA: Diagnosis not present

## 2014-06-08 DIAGNOSIS — N2581 Secondary hyperparathyroidism of renal origin: Secondary | ICD-10-CM | POA: Diagnosis not present

## 2014-06-08 DIAGNOSIS — D631 Anemia in chronic kidney disease: Secondary | ICD-10-CM | POA: Diagnosis not present

## 2014-06-11 DIAGNOSIS — D509 Iron deficiency anemia, unspecified: Secondary | ICD-10-CM | POA: Diagnosis not present

## 2014-06-11 DIAGNOSIS — D631 Anemia in chronic kidney disease: Secondary | ICD-10-CM | POA: Diagnosis not present

## 2014-06-11 DIAGNOSIS — N2581 Secondary hyperparathyroidism of renal origin: Secondary | ICD-10-CM | POA: Diagnosis not present

## 2014-06-11 DIAGNOSIS — N186 End stage renal disease: Secondary | ICD-10-CM | POA: Diagnosis not present

## 2014-06-13 DIAGNOSIS — N2581 Secondary hyperparathyroidism of renal origin: Secondary | ICD-10-CM | POA: Diagnosis not present

## 2014-06-13 DIAGNOSIS — D631 Anemia in chronic kidney disease: Secondary | ICD-10-CM | POA: Diagnosis not present

## 2014-06-13 DIAGNOSIS — N186 End stage renal disease: Secondary | ICD-10-CM | POA: Diagnosis not present

## 2014-06-13 DIAGNOSIS — D509 Iron deficiency anemia, unspecified: Secondary | ICD-10-CM | POA: Diagnosis not present

## 2014-06-15 DIAGNOSIS — N186 End stage renal disease: Secondary | ICD-10-CM | POA: Diagnosis not present

## 2014-06-15 DIAGNOSIS — D509 Iron deficiency anemia, unspecified: Secondary | ICD-10-CM | POA: Diagnosis not present

## 2014-06-15 DIAGNOSIS — N2581 Secondary hyperparathyroidism of renal origin: Secondary | ICD-10-CM | POA: Diagnosis not present

## 2014-06-15 DIAGNOSIS — D631 Anemia in chronic kidney disease: Secondary | ICD-10-CM | POA: Diagnosis not present

## 2014-06-18 DIAGNOSIS — D509 Iron deficiency anemia, unspecified: Secondary | ICD-10-CM | POA: Diagnosis not present

## 2014-06-18 DIAGNOSIS — N2581 Secondary hyperparathyroidism of renal origin: Secondary | ICD-10-CM | POA: Diagnosis not present

## 2014-06-18 DIAGNOSIS — D631 Anemia in chronic kidney disease: Secondary | ICD-10-CM | POA: Diagnosis not present

## 2014-06-18 DIAGNOSIS — N186 End stage renal disease: Secondary | ICD-10-CM | POA: Diagnosis not present

## 2014-06-19 DIAGNOSIS — N2581 Secondary hyperparathyroidism of renal origin: Secondary | ICD-10-CM | POA: Diagnosis not present

## 2014-06-19 DIAGNOSIS — D631 Anemia in chronic kidney disease: Secondary | ICD-10-CM | POA: Diagnosis not present

## 2014-06-19 DIAGNOSIS — D509 Iron deficiency anemia, unspecified: Secondary | ICD-10-CM | POA: Diagnosis not present

## 2014-06-19 DIAGNOSIS — N186 End stage renal disease: Secondary | ICD-10-CM | POA: Diagnosis not present

## 2014-06-22 DIAGNOSIS — D631 Anemia in chronic kidney disease: Secondary | ICD-10-CM | POA: Diagnosis not present

## 2014-06-22 DIAGNOSIS — N186 End stage renal disease: Secondary | ICD-10-CM | POA: Diagnosis not present

## 2014-06-22 DIAGNOSIS — N2581 Secondary hyperparathyroidism of renal origin: Secondary | ICD-10-CM | POA: Diagnosis not present

## 2014-06-22 DIAGNOSIS — D509 Iron deficiency anemia, unspecified: Secondary | ICD-10-CM | POA: Diagnosis not present

## 2014-06-24 DIAGNOSIS — Z992 Dependence on renal dialysis: Secondary | ICD-10-CM | POA: Diagnosis not present

## 2014-06-24 DIAGNOSIS — N186 End stage renal disease: Secondary | ICD-10-CM | POA: Diagnosis not present

## 2014-06-25 DIAGNOSIS — D631 Anemia in chronic kidney disease: Secondary | ICD-10-CM | POA: Diagnosis not present

## 2014-06-25 DIAGNOSIS — N186 End stage renal disease: Secondary | ICD-10-CM | POA: Diagnosis not present

## 2014-06-25 DIAGNOSIS — D509 Iron deficiency anemia, unspecified: Secondary | ICD-10-CM | POA: Diagnosis not present

## 2014-06-25 DIAGNOSIS — N2581 Secondary hyperparathyroidism of renal origin: Secondary | ICD-10-CM | POA: Diagnosis not present

## 2014-06-27 DIAGNOSIS — D631 Anemia in chronic kidney disease: Secondary | ICD-10-CM | POA: Diagnosis not present

## 2014-06-27 DIAGNOSIS — N2581 Secondary hyperparathyroidism of renal origin: Secondary | ICD-10-CM | POA: Diagnosis not present

## 2014-06-27 DIAGNOSIS — D509 Iron deficiency anemia, unspecified: Secondary | ICD-10-CM | POA: Diagnosis not present

## 2014-06-27 DIAGNOSIS — N186 End stage renal disease: Secondary | ICD-10-CM | POA: Diagnosis not present

## 2014-06-29 DIAGNOSIS — D631 Anemia in chronic kidney disease: Secondary | ICD-10-CM | POA: Diagnosis not present

## 2014-06-29 DIAGNOSIS — N186 End stage renal disease: Secondary | ICD-10-CM | POA: Diagnosis not present

## 2014-06-29 DIAGNOSIS — N2581 Secondary hyperparathyroidism of renal origin: Secondary | ICD-10-CM | POA: Diagnosis not present

## 2014-06-29 DIAGNOSIS — D509 Iron deficiency anemia, unspecified: Secondary | ICD-10-CM | POA: Diagnosis not present

## 2014-07-02 DIAGNOSIS — N186 End stage renal disease: Secondary | ICD-10-CM | POA: Diagnosis not present

## 2014-07-02 DIAGNOSIS — N2581 Secondary hyperparathyroidism of renal origin: Secondary | ICD-10-CM | POA: Diagnosis not present

## 2014-07-02 DIAGNOSIS — D509 Iron deficiency anemia, unspecified: Secondary | ICD-10-CM | POA: Diagnosis not present

## 2014-07-02 DIAGNOSIS — D631 Anemia in chronic kidney disease: Secondary | ICD-10-CM | POA: Diagnosis not present

## 2014-07-04 ENCOUNTER — Encounter (HOSPITAL_COMMUNITY): Payer: Self-pay | Admitting: Cardiovascular Disease

## 2014-07-04 DIAGNOSIS — D509 Iron deficiency anemia, unspecified: Secondary | ICD-10-CM | POA: Diagnosis not present

## 2014-07-04 DIAGNOSIS — N186 End stage renal disease: Secondary | ICD-10-CM | POA: Diagnosis not present

## 2014-07-04 DIAGNOSIS — N2581 Secondary hyperparathyroidism of renal origin: Secondary | ICD-10-CM | POA: Diagnosis not present

## 2014-07-04 DIAGNOSIS — H3532 Exudative age-related macular degeneration: Secondary | ICD-10-CM | POA: Diagnosis not present

## 2014-07-04 DIAGNOSIS — D631 Anemia in chronic kidney disease: Secondary | ICD-10-CM | POA: Diagnosis not present

## 2014-07-06 DIAGNOSIS — N186 End stage renal disease: Secondary | ICD-10-CM | POA: Diagnosis not present

## 2014-07-06 DIAGNOSIS — D631 Anemia in chronic kidney disease: Secondary | ICD-10-CM | POA: Diagnosis not present

## 2014-07-06 DIAGNOSIS — D509 Iron deficiency anemia, unspecified: Secondary | ICD-10-CM | POA: Diagnosis not present

## 2014-07-06 DIAGNOSIS — N2581 Secondary hyperparathyroidism of renal origin: Secondary | ICD-10-CM | POA: Diagnosis not present

## 2014-07-09 DIAGNOSIS — N2581 Secondary hyperparathyroidism of renal origin: Secondary | ICD-10-CM | POA: Diagnosis not present

## 2014-07-09 DIAGNOSIS — N186 End stage renal disease: Secondary | ICD-10-CM | POA: Diagnosis not present

## 2014-07-09 DIAGNOSIS — D631 Anemia in chronic kidney disease: Secondary | ICD-10-CM | POA: Diagnosis not present

## 2014-07-09 DIAGNOSIS — D509 Iron deficiency anemia, unspecified: Secondary | ICD-10-CM | POA: Diagnosis not present

## 2014-07-11 DIAGNOSIS — D631 Anemia in chronic kidney disease: Secondary | ICD-10-CM | POA: Diagnosis not present

## 2014-07-11 DIAGNOSIS — D509 Iron deficiency anemia, unspecified: Secondary | ICD-10-CM | POA: Diagnosis not present

## 2014-07-11 DIAGNOSIS — N186 End stage renal disease: Secondary | ICD-10-CM | POA: Diagnosis not present

## 2014-07-11 DIAGNOSIS — N2581 Secondary hyperparathyroidism of renal origin: Secondary | ICD-10-CM | POA: Diagnosis not present

## 2014-07-13 DIAGNOSIS — N2581 Secondary hyperparathyroidism of renal origin: Secondary | ICD-10-CM | POA: Diagnosis not present

## 2014-07-13 DIAGNOSIS — D509 Iron deficiency anemia, unspecified: Secondary | ICD-10-CM | POA: Diagnosis not present

## 2014-07-13 DIAGNOSIS — N186 End stage renal disease: Secondary | ICD-10-CM | POA: Diagnosis not present

## 2014-07-13 DIAGNOSIS — D631 Anemia in chronic kidney disease: Secondary | ICD-10-CM | POA: Diagnosis not present

## 2014-07-15 DIAGNOSIS — Z01811 Encounter for preprocedural respiratory examination: Secondary | ICD-10-CM | POA: Diagnosis not present

## 2014-07-16 DIAGNOSIS — D631 Anemia in chronic kidney disease: Secondary | ICD-10-CM | POA: Diagnosis not present

## 2014-07-16 DIAGNOSIS — D509 Iron deficiency anemia, unspecified: Secondary | ICD-10-CM | POA: Diagnosis not present

## 2014-07-16 DIAGNOSIS — N2581 Secondary hyperparathyroidism of renal origin: Secondary | ICD-10-CM | POA: Diagnosis not present

## 2014-07-16 DIAGNOSIS — N186 End stage renal disease: Secondary | ICD-10-CM | POA: Diagnosis not present

## 2014-07-18 DIAGNOSIS — N2581 Secondary hyperparathyroidism of renal origin: Secondary | ICD-10-CM | POA: Diagnosis not present

## 2014-07-18 DIAGNOSIS — N186 End stage renal disease: Secondary | ICD-10-CM | POA: Diagnosis not present

## 2014-07-18 DIAGNOSIS — D631 Anemia in chronic kidney disease: Secondary | ICD-10-CM | POA: Diagnosis not present

## 2014-07-18 DIAGNOSIS — D509 Iron deficiency anemia, unspecified: Secondary | ICD-10-CM | POA: Diagnosis not present

## 2014-07-20 DIAGNOSIS — D509 Iron deficiency anemia, unspecified: Secondary | ICD-10-CM | POA: Diagnosis not present

## 2014-07-20 DIAGNOSIS — N2581 Secondary hyperparathyroidism of renal origin: Secondary | ICD-10-CM | POA: Diagnosis not present

## 2014-07-20 DIAGNOSIS — N186 End stage renal disease: Secondary | ICD-10-CM | POA: Diagnosis not present

## 2014-07-20 DIAGNOSIS — D631 Anemia in chronic kidney disease: Secondary | ICD-10-CM | POA: Diagnosis not present

## 2014-07-22 DIAGNOSIS — D649 Anemia, unspecified: Secondary | ICD-10-CM | POA: Diagnosis not present

## 2014-07-22 DIAGNOSIS — Z992 Dependence on renal dialysis: Secondary | ICD-10-CM | POA: Diagnosis not present

## 2014-07-22 DIAGNOSIS — N186 End stage renal disease: Secondary | ICD-10-CM | POA: Diagnosis not present

## 2014-07-22 DIAGNOSIS — E785 Hyperlipidemia, unspecified: Secondary | ICD-10-CM | POA: Diagnosis not present

## 2014-07-22 DIAGNOSIS — Z8719 Personal history of other diseases of the digestive system: Secondary | ICD-10-CM | POA: Diagnosis not present

## 2014-07-23 DIAGNOSIS — N2581 Secondary hyperparathyroidism of renal origin: Secondary | ICD-10-CM | POA: Diagnosis not present

## 2014-07-23 DIAGNOSIS — D509 Iron deficiency anemia, unspecified: Secondary | ICD-10-CM | POA: Diagnosis not present

## 2014-07-23 DIAGNOSIS — N186 End stage renal disease: Secondary | ICD-10-CM | POA: Diagnosis not present

## 2014-07-23 DIAGNOSIS — D631 Anemia in chronic kidney disease: Secondary | ICD-10-CM | POA: Diagnosis not present

## 2014-07-25 DIAGNOSIS — N186 End stage renal disease: Secondary | ICD-10-CM | POA: Diagnosis not present

## 2014-07-25 DIAGNOSIS — D509 Iron deficiency anemia, unspecified: Secondary | ICD-10-CM | POA: Diagnosis not present

## 2014-07-25 DIAGNOSIS — D631 Anemia in chronic kidney disease: Secondary | ICD-10-CM | POA: Diagnosis not present

## 2014-07-25 DIAGNOSIS — Z992 Dependence on renal dialysis: Secondary | ICD-10-CM | POA: Diagnosis not present

## 2014-07-25 DIAGNOSIS — N2581 Secondary hyperparathyroidism of renal origin: Secondary | ICD-10-CM | POA: Diagnosis not present

## 2014-07-27 DIAGNOSIS — D631 Anemia in chronic kidney disease: Secondary | ICD-10-CM | POA: Diagnosis not present

## 2014-07-27 DIAGNOSIS — N186 End stage renal disease: Secondary | ICD-10-CM | POA: Diagnosis not present

## 2014-07-27 DIAGNOSIS — N2581 Secondary hyperparathyroidism of renal origin: Secondary | ICD-10-CM | POA: Diagnosis not present

## 2014-07-30 DIAGNOSIS — N186 End stage renal disease: Secondary | ICD-10-CM | POA: Diagnosis not present

## 2014-07-30 DIAGNOSIS — N2581 Secondary hyperparathyroidism of renal origin: Secondary | ICD-10-CM | POA: Diagnosis not present

## 2014-07-30 DIAGNOSIS — D631 Anemia in chronic kidney disease: Secondary | ICD-10-CM | POA: Diagnosis not present

## 2014-08-01 DIAGNOSIS — N2581 Secondary hyperparathyroidism of renal origin: Secondary | ICD-10-CM | POA: Diagnosis not present

## 2014-08-01 DIAGNOSIS — N186 End stage renal disease: Secondary | ICD-10-CM | POA: Diagnosis not present

## 2014-08-01 DIAGNOSIS — D631 Anemia in chronic kidney disease: Secondary | ICD-10-CM | POA: Diagnosis not present

## 2014-08-03 DIAGNOSIS — N186 End stage renal disease: Secondary | ICD-10-CM | POA: Diagnosis not present

## 2014-08-03 DIAGNOSIS — N2581 Secondary hyperparathyroidism of renal origin: Secondary | ICD-10-CM | POA: Diagnosis not present

## 2014-08-03 DIAGNOSIS — D631 Anemia in chronic kidney disease: Secondary | ICD-10-CM | POA: Diagnosis not present

## 2014-08-06 DIAGNOSIS — H3532 Exudative age-related macular degeneration: Secondary | ICD-10-CM | POA: Diagnosis not present

## 2014-08-06 DIAGNOSIS — N2581 Secondary hyperparathyroidism of renal origin: Secondary | ICD-10-CM | POA: Diagnosis not present

## 2014-08-06 DIAGNOSIS — N186 End stage renal disease: Secondary | ICD-10-CM | POA: Diagnosis not present

## 2014-08-06 DIAGNOSIS — D631 Anemia in chronic kidney disease: Secondary | ICD-10-CM | POA: Diagnosis not present

## 2014-08-08 DIAGNOSIS — D631 Anemia in chronic kidney disease: Secondary | ICD-10-CM | POA: Diagnosis not present

## 2014-08-08 DIAGNOSIS — N186 End stage renal disease: Secondary | ICD-10-CM | POA: Diagnosis not present

## 2014-08-08 DIAGNOSIS — N2581 Secondary hyperparathyroidism of renal origin: Secondary | ICD-10-CM | POA: Diagnosis not present

## 2014-08-10 DIAGNOSIS — N2581 Secondary hyperparathyroidism of renal origin: Secondary | ICD-10-CM | POA: Diagnosis not present

## 2014-08-10 DIAGNOSIS — N186 End stage renal disease: Secondary | ICD-10-CM | POA: Diagnosis not present

## 2014-08-10 DIAGNOSIS — D631 Anemia in chronic kidney disease: Secondary | ICD-10-CM | POA: Diagnosis not present

## 2014-08-13 DIAGNOSIS — D631 Anemia in chronic kidney disease: Secondary | ICD-10-CM | POA: Diagnosis not present

## 2014-08-13 DIAGNOSIS — N2581 Secondary hyperparathyroidism of renal origin: Secondary | ICD-10-CM | POA: Diagnosis not present

## 2014-08-13 DIAGNOSIS — N186 End stage renal disease: Secondary | ICD-10-CM | POA: Diagnosis not present

## 2014-08-15 DIAGNOSIS — D631 Anemia in chronic kidney disease: Secondary | ICD-10-CM | POA: Diagnosis not present

## 2014-08-15 DIAGNOSIS — N186 End stage renal disease: Secondary | ICD-10-CM | POA: Diagnosis not present

## 2014-08-15 DIAGNOSIS — N2581 Secondary hyperparathyroidism of renal origin: Secondary | ICD-10-CM | POA: Diagnosis not present

## 2014-08-17 DIAGNOSIS — D631 Anemia in chronic kidney disease: Secondary | ICD-10-CM | POA: Diagnosis not present

## 2014-08-17 DIAGNOSIS — N186 End stage renal disease: Secondary | ICD-10-CM | POA: Diagnosis not present

## 2014-08-17 DIAGNOSIS — N2581 Secondary hyperparathyroidism of renal origin: Secondary | ICD-10-CM | POA: Diagnosis not present

## 2014-08-20 DIAGNOSIS — N186 End stage renal disease: Secondary | ICD-10-CM | POA: Diagnosis not present

## 2014-08-20 DIAGNOSIS — N2581 Secondary hyperparathyroidism of renal origin: Secondary | ICD-10-CM | POA: Diagnosis not present

## 2014-08-20 DIAGNOSIS — D631 Anemia in chronic kidney disease: Secondary | ICD-10-CM | POA: Diagnosis not present

## 2014-08-22 DIAGNOSIS — N186 End stage renal disease: Secondary | ICD-10-CM | POA: Diagnosis not present

## 2014-08-22 DIAGNOSIS — N2581 Secondary hyperparathyroidism of renal origin: Secondary | ICD-10-CM | POA: Diagnosis not present

## 2014-08-22 DIAGNOSIS — D631 Anemia in chronic kidney disease: Secondary | ICD-10-CM | POA: Diagnosis not present

## 2014-08-24 DIAGNOSIS — N2581 Secondary hyperparathyroidism of renal origin: Secondary | ICD-10-CM | POA: Diagnosis not present

## 2014-08-24 DIAGNOSIS — N186 End stage renal disease: Secondary | ICD-10-CM | POA: Diagnosis not present

## 2014-08-24 DIAGNOSIS — D631 Anemia in chronic kidney disease: Secondary | ICD-10-CM | POA: Diagnosis not present

## 2014-08-25 DIAGNOSIS — Z992 Dependence on renal dialysis: Secondary | ICD-10-CM | POA: Diagnosis not present

## 2014-08-25 DIAGNOSIS — N186 End stage renal disease: Secondary | ICD-10-CM | POA: Diagnosis not present

## 2014-08-27 DIAGNOSIS — N186 End stage renal disease: Secondary | ICD-10-CM | POA: Diagnosis not present

## 2014-08-27 DIAGNOSIS — D631 Anemia in chronic kidney disease: Secondary | ICD-10-CM | POA: Diagnosis not present

## 2014-08-27 DIAGNOSIS — N2581 Secondary hyperparathyroidism of renal origin: Secondary | ICD-10-CM | POA: Diagnosis not present

## 2014-08-27 DIAGNOSIS — D509 Iron deficiency anemia, unspecified: Secondary | ICD-10-CM | POA: Diagnosis not present

## 2014-08-29 DIAGNOSIS — N2581 Secondary hyperparathyroidism of renal origin: Secondary | ICD-10-CM | POA: Diagnosis not present

## 2014-08-29 DIAGNOSIS — D509 Iron deficiency anemia, unspecified: Secondary | ICD-10-CM | POA: Diagnosis not present

## 2014-08-29 DIAGNOSIS — N186 End stage renal disease: Secondary | ICD-10-CM | POA: Diagnosis not present

## 2014-08-29 DIAGNOSIS — D631 Anemia in chronic kidney disease: Secondary | ICD-10-CM | POA: Diagnosis not present

## 2014-08-31 DIAGNOSIS — N2581 Secondary hyperparathyroidism of renal origin: Secondary | ICD-10-CM | POA: Diagnosis not present

## 2014-08-31 DIAGNOSIS — D631 Anemia in chronic kidney disease: Secondary | ICD-10-CM | POA: Diagnosis not present

## 2014-08-31 DIAGNOSIS — D509 Iron deficiency anemia, unspecified: Secondary | ICD-10-CM | POA: Diagnosis not present

## 2014-08-31 DIAGNOSIS — N186 End stage renal disease: Secondary | ICD-10-CM | POA: Diagnosis not present

## 2014-09-03 DIAGNOSIS — N186 End stage renal disease: Secondary | ICD-10-CM | POA: Diagnosis not present

## 2014-09-03 DIAGNOSIS — D631 Anemia in chronic kidney disease: Secondary | ICD-10-CM | POA: Diagnosis not present

## 2014-09-03 DIAGNOSIS — N2581 Secondary hyperparathyroidism of renal origin: Secondary | ICD-10-CM | POA: Diagnosis not present

## 2014-09-03 DIAGNOSIS — D509 Iron deficiency anemia, unspecified: Secondary | ICD-10-CM | POA: Diagnosis not present

## 2014-09-05 DIAGNOSIS — D631 Anemia in chronic kidney disease: Secondary | ICD-10-CM | POA: Diagnosis not present

## 2014-09-05 DIAGNOSIS — N2581 Secondary hyperparathyroidism of renal origin: Secondary | ICD-10-CM | POA: Diagnosis not present

## 2014-09-05 DIAGNOSIS — N186 End stage renal disease: Secondary | ICD-10-CM | POA: Diagnosis not present

## 2014-09-05 DIAGNOSIS — D509 Iron deficiency anemia, unspecified: Secondary | ICD-10-CM | POA: Diagnosis not present

## 2014-09-07 DIAGNOSIS — D631 Anemia in chronic kidney disease: Secondary | ICD-10-CM | POA: Diagnosis not present

## 2014-09-07 DIAGNOSIS — N2581 Secondary hyperparathyroidism of renal origin: Secondary | ICD-10-CM | POA: Diagnosis not present

## 2014-09-07 DIAGNOSIS — N186 End stage renal disease: Secondary | ICD-10-CM | POA: Diagnosis not present

## 2014-09-07 DIAGNOSIS — D509 Iron deficiency anemia, unspecified: Secondary | ICD-10-CM | POA: Diagnosis not present

## 2014-09-10 DIAGNOSIS — D509 Iron deficiency anemia, unspecified: Secondary | ICD-10-CM | POA: Diagnosis not present

## 2014-09-10 DIAGNOSIS — D631 Anemia in chronic kidney disease: Secondary | ICD-10-CM | POA: Diagnosis not present

## 2014-09-10 DIAGNOSIS — N186 End stage renal disease: Secondary | ICD-10-CM | POA: Diagnosis not present

## 2014-09-10 DIAGNOSIS — N2581 Secondary hyperparathyroidism of renal origin: Secondary | ICD-10-CM | POA: Diagnosis not present

## 2014-09-12 DIAGNOSIS — D509 Iron deficiency anemia, unspecified: Secondary | ICD-10-CM | POA: Diagnosis not present

## 2014-09-12 DIAGNOSIS — N2581 Secondary hyperparathyroidism of renal origin: Secondary | ICD-10-CM | POA: Diagnosis not present

## 2014-09-12 DIAGNOSIS — D631 Anemia in chronic kidney disease: Secondary | ICD-10-CM | POA: Diagnosis not present

## 2014-09-12 DIAGNOSIS — N186 End stage renal disease: Secondary | ICD-10-CM | POA: Diagnosis not present

## 2014-09-14 DIAGNOSIS — D509 Iron deficiency anemia, unspecified: Secondary | ICD-10-CM | POA: Diagnosis not present

## 2014-09-14 DIAGNOSIS — N186 End stage renal disease: Secondary | ICD-10-CM | POA: Diagnosis not present

## 2014-09-14 DIAGNOSIS — N2581 Secondary hyperparathyroidism of renal origin: Secondary | ICD-10-CM | POA: Diagnosis not present

## 2014-09-14 DIAGNOSIS — D631 Anemia in chronic kidney disease: Secondary | ICD-10-CM | POA: Diagnosis not present

## 2014-09-17 DIAGNOSIS — N186 End stage renal disease: Secondary | ICD-10-CM | POA: Diagnosis not present

## 2014-09-17 DIAGNOSIS — D509 Iron deficiency anemia, unspecified: Secondary | ICD-10-CM | POA: Diagnosis not present

## 2014-09-17 DIAGNOSIS — D631 Anemia in chronic kidney disease: Secondary | ICD-10-CM | POA: Diagnosis not present

## 2014-09-17 DIAGNOSIS — N2581 Secondary hyperparathyroidism of renal origin: Secondary | ICD-10-CM | POA: Diagnosis not present

## 2014-09-18 DIAGNOSIS — I10 Essential (primary) hypertension: Secondary | ICD-10-CM | POA: Diagnosis not present

## 2014-09-18 DIAGNOSIS — M5431 Sciatica, right side: Secondary | ICD-10-CM | POA: Diagnosis not present

## 2014-09-18 DIAGNOSIS — F17210 Nicotine dependence, cigarettes, uncomplicated: Secondary | ICD-10-CM | POA: Diagnosis not present

## 2014-09-19 DIAGNOSIS — D509 Iron deficiency anemia, unspecified: Secondary | ICD-10-CM | POA: Diagnosis not present

## 2014-09-19 DIAGNOSIS — D631 Anemia in chronic kidney disease: Secondary | ICD-10-CM | POA: Diagnosis not present

## 2014-09-19 DIAGNOSIS — N186 End stage renal disease: Secondary | ICD-10-CM | POA: Diagnosis not present

## 2014-09-19 DIAGNOSIS — N2581 Secondary hyperparathyroidism of renal origin: Secondary | ICD-10-CM | POA: Diagnosis not present

## 2014-09-21 DIAGNOSIS — N2581 Secondary hyperparathyroidism of renal origin: Secondary | ICD-10-CM | POA: Diagnosis not present

## 2014-09-21 DIAGNOSIS — D509 Iron deficiency anemia, unspecified: Secondary | ICD-10-CM | POA: Diagnosis not present

## 2014-09-21 DIAGNOSIS — D631 Anemia in chronic kidney disease: Secondary | ICD-10-CM | POA: Diagnosis not present

## 2014-09-21 DIAGNOSIS — N186 End stage renal disease: Secondary | ICD-10-CM | POA: Diagnosis not present

## 2014-09-23 DIAGNOSIS — Z992 Dependence on renal dialysis: Secondary | ICD-10-CM | POA: Diagnosis not present

## 2014-09-23 DIAGNOSIS — N186 End stage renal disease: Secondary | ICD-10-CM | POA: Diagnosis not present

## 2014-09-24 DIAGNOSIS — D509 Iron deficiency anemia, unspecified: Secondary | ICD-10-CM | POA: Diagnosis not present

## 2014-09-24 DIAGNOSIS — D631 Anemia in chronic kidney disease: Secondary | ICD-10-CM | POA: Diagnosis not present

## 2014-09-24 DIAGNOSIS — N186 End stage renal disease: Secondary | ICD-10-CM | POA: Diagnosis not present

## 2014-09-24 DIAGNOSIS — N2581 Secondary hyperparathyroidism of renal origin: Secondary | ICD-10-CM | POA: Diagnosis not present

## 2014-09-26 DIAGNOSIS — D509 Iron deficiency anemia, unspecified: Secondary | ICD-10-CM | POA: Diagnosis not present

## 2014-09-26 DIAGNOSIS — N186 End stage renal disease: Secondary | ICD-10-CM | POA: Diagnosis not present

## 2014-09-26 DIAGNOSIS — N2581 Secondary hyperparathyroidism of renal origin: Secondary | ICD-10-CM | POA: Diagnosis not present

## 2014-09-26 DIAGNOSIS — D631 Anemia in chronic kidney disease: Secondary | ICD-10-CM | POA: Diagnosis not present

## 2014-09-28 DIAGNOSIS — D631 Anemia in chronic kidney disease: Secondary | ICD-10-CM | POA: Diagnosis not present

## 2014-09-28 DIAGNOSIS — N2581 Secondary hyperparathyroidism of renal origin: Secondary | ICD-10-CM | POA: Diagnosis not present

## 2014-09-28 DIAGNOSIS — N186 End stage renal disease: Secondary | ICD-10-CM | POA: Diagnosis not present

## 2014-09-28 DIAGNOSIS — D509 Iron deficiency anemia, unspecified: Secondary | ICD-10-CM | POA: Diagnosis not present

## 2014-09-28 HISTORY — PX: KIDNEY TRANSPLANT: SHX239

## 2014-10-01 DIAGNOSIS — D631 Anemia in chronic kidney disease: Secondary | ICD-10-CM | POA: Diagnosis not present

## 2014-10-01 DIAGNOSIS — N186 End stage renal disease: Secondary | ICD-10-CM | POA: Diagnosis not present

## 2014-10-01 DIAGNOSIS — D509 Iron deficiency anemia, unspecified: Secondary | ICD-10-CM | POA: Diagnosis not present

## 2014-10-01 DIAGNOSIS — N2581 Secondary hyperparathyroidism of renal origin: Secondary | ICD-10-CM | POA: Diagnosis not present

## 2014-10-02 DIAGNOSIS — R112 Nausea with vomiting, unspecified: Secondary | ICD-10-CM | POA: Diagnosis not present

## 2014-10-02 DIAGNOSIS — M5430 Sciatica, unspecified side: Secondary | ICD-10-CM | POA: Diagnosis not present

## 2014-10-02 DIAGNOSIS — R531 Weakness: Secondary | ICD-10-CM | POA: Diagnosis not present

## 2014-10-02 DIAGNOSIS — Z87891 Personal history of nicotine dependence: Secondary | ICD-10-CM | POA: Diagnosis not present

## 2014-10-03 DIAGNOSIS — N186 End stage renal disease: Secondary | ICD-10-CM | POA: Diagnosis not present

## 2014-10-03 DIAGNOSIS — N2581 Secondary hyperparathyroidism of renal origin: Secondary | ICD-10-CM | POA: Diagnosis not present

## 2014-10-03 DIAGNOSIS — D509 Iron deficiency anemia, unspecified: Secondary | ICD-10-CM | POA: Diagnosis not present

## 2014-10-03 DIAGNOSIS — D631 Anemia in chronic kidney disease: Secondary | ICD-10-CM | POA: Diagnosis not present

## 2014-10-04 DIAGNOSIS — M5136 Other intervertebral disc degeneration, lumbar region: Secondary | ICD-10-CM | POA: Diagnosis not present

## 2014-10-05 DIAGNOSIS — N186 End stage renal disease: Secondary | ICD-10-CM | POA: Diagnosis not present

## 2014-10-05 DIAGNOSIS — N2581 Secondary hyperparathyroidism of renal origin: Secondary | ICD-10-CM | POA: Diagnosis not present

## 2014-10-05 DIAGNOSIS — D631 Anemia in chronic kidney disease: Secondary | ICD-10-CM | POA: Diagnosis not present

## 2014-10-05 DIAGNOSIS — D509 Iron deficiency anemia, unspecified: Secondary | ICD-10-CM | POA: Diagnosis not present

## 2014-10-08 DIAGNOSIS — N2581 Secondary hyperparathyroidism of renal origin: Secondary | ICD-10-CM | POA: Diagnosis not present

## 2014-10-08 DIAGNOSIS — D631 Anemia in chronic kidney disease: Secondary | ICD-10-CM | POA: Diagnosis not present

## 2014-10-08 DIAGNOSIS — N186 End stage renal disease: Secondary | ICD-10-CM | POA: Diagnosis not present

## 2014-10-08 DIAGNOSIS — D509 Iron deficiency anemia, unspecified: Secondary | ICD-10-CM | POA: Diagnosis not present

## 2014-10-10 DIAGNOSIS — D631 Anemia in chronic kidney disease: Secondary | ICD-10-CM | POA: Diagnosis not present

## 2014-10-10 DIAGNOSIS — N186 End stage renal disease: Secondary | ICD-10-CM | POA: Diagnosis not present

## 2014-10-10 DIAGNOSIS — D509 Iron deficiency anemia, unspecified: Secondary | ICD-10-CM | POA: Diagnosis not present

## 2014-10-10 DIAGNOSIS — N2581 Secondary hyperparathyroidism of renal origin: Secondary | ICD-10-CM | POA: Diagnosis not present

## 2014-10-12 DIAGNOSIS — D509 Iron deficiency anemia, unspecified: Secondary | ICD-10-CM | POA: Diagnosis not present

## 2014-10-12 DIAGNOSIS — D631 Anemia in chronic kidney disease: Secondary | ICD-10-CM | POA: Diagnosis not present

## 2014-10-12 DIAGNOSIS — N186 End stage renal disease: Secondary | ICD-10-CM | POA: Diagnosis not present

## 2014-10-12 DIAGNOSIS — N2581 Secondary hyperparathyroidism of renal origin: Secondary | ICD-10-CM | POA: Diagnosis not present

## 2014-10-15 DIAGNOSIS — N2581 Secondary hyperparathyroidism of renal origin: Secondary | ICD-10-CM | POA: Diagnosis not present

## 2014-10-15 DIAGNOSIS — N186 End stage renal disease: Secondary | ICD-10-CM | POA: Diagnosis not present

## 2014-10-15 DIAGNOSIS — D631 Anemia in chronic kidney disease: Secondary | ICD-10-CM | POA: Diagnosis not present

## 2014-10-15 DIAGNOSIS — D509 Iron deficiency anemia, unspecified: Secondary | ICD-10-CM | POA: Diagnosis not present

## 2014-10-17 DIAGNOSIS — N2581 Secondary hyperparathyroidism of renal origin: Secondary | ICD-10-CM | POA: Diagnosis not present

## 2014-10-17 DIAGNOSIS — D509 Iron deficiency anemia, unspecified: Secondary | ICD-10-CM | POA: Diagnosis not present

## 2014-10-17 DIAGNOSIS — D631 Anemia in chronic kidney disease: Secondary | ICD-10-CM | POA: Diagnosis not present

## 2014-10-17 DIAGNOSIS — N186 End stage renal disease: Secondary | ICD-10-CM | POA: Diagnosis not present

## 2014-10-19 DIAGNOSIS — N2581 Secondary hyperparathyroidism of renal origin: Secondary | ICD-10-CM | POA: Diagnosis not present

## 2014-10-19 DIAGNOSIS — D631 Anemia in chronic kidney disease: Secondary | ICD-10-CM | POA: Diagnosis not present

## 2014-10-19 DIAGNOSIS — D509 Iron deficiency anemia, unspecified: Secondary | ICD-10-CM | POA: Diagnosis not present

## 2014-10-19 DIAGNOSIS — N186 End stage renal disease: Secondary | ICD-10-CM | POA: Diagnosis not present

## 2014-10-22 DIAGNOSIS — N2581 Secondary hyperparathyroidism of renal origin: Secondary | ICD-10-CM | POA: Diagnosis not present

## 2014-10-22 DIAGNOSIS — D631 Anemia in chronic kidney disease: Secondary | ICD-10-CM | POA: Diagnosis not present

## 2014-10-22 DIAGNOSIS — N186 End stage renal disease: Secondary | ICD-10-CM | POA: Diagnosis not present

## 2014-10-22 DIAGNOSIS — D509 Iron deficiency anemia, unspecified: Secondary | ICD-10-CM | POA: Diagnosis not present

## 2014-10-24 DIAGNOSIS — D509 Iron deficiency anemia, unspecified: Secondary | ICD-10-CM | POA: Diagnosis not present

## 2014-10-24 DIAGNOSIS — N2581 Secondary hyperparathyroidism of renal origin: Secondary | ICD-10-CM | POA: Diagnosis not present

## 2014-10-24 DIAGNOSIS — I120 Hypertensive chronic kidney disease with stage 5 chronic kidney disease or end stage renal disease: Secondary | ICD-10-CM | POA: Diagnosis not present

## 2014-10-24 DIAGNOSIS — Z992 Dependence on renal dialysis: Secondary | ICD-10-CM | POA: Diagnosis not present

## 2014-10-24 DIAGNOSIS — N186 End stage renal disease: Secondary | ICD-10-CM | POA: Diagnosis not present

## 2014-10-24 DIAGNOSIS — D631 Anemia in chronic kidney disease: Secondary | ICD-10-CM | POA: Diagnosis not present

## 2014-10-25 DIAGNOSIS — E785 Hyperlipidemia, unspecified: Secondary | ICD-10-CM | POA: Diagnosis not present

## 2014-10-25 DIAGNOSIS — Z6826 Body mass index (BMI) 26.0-26.9, adult: Secondary | ICD-10-CM | POA: Diagnosis not present

## 2014-10-25 DIAGNOSIS — M545 Low back pain: Secondary | ICD-10-CM | POA: Diagnosis not present

## 2014-10-25 DIAGNOSIS — I10 Essential (primary) hypertension: Secondary | ICD-10-CM | POA: Diagnosis not present

## 2014-10-25 DIAGNOSIS — N185 Chronic kidney disease, stage 5: Secondary | ICD-10-CM | POA: Diagnosis not present

## 2014-10-26 DIAGNOSIS — D631 Anemia in chronic kidney disease: Secondary | ICD-10-CM | POA: Diagnosis not present

## 2014-10-26 DIAGNOSIS — D509 Iron deficiency anemia, unspecified: Secondary | ICD-10-CM | POA: Diagnosis not present

## 2014-10-26 DIAGNOSIS — N186 End stage renal disease: Secondary | ICD-10-CM | POA: Diagnosis not present

## 2014-10-26 DIAGNOSIS — N2581 Secondary hyperparathyroidism of renal origin: Secondary | ICD-10-CM | POA: Diagnosis not present

## 2014-10-28 DIAGNOSIS — N186 End stage renal disease: Secondary | ICD-10-CM | POA: Diagnosis not present

## 2014-10-28 DIAGNOSIS — I878 Other specified disorders of veins: Secondary | ICD-10-CM | POA: Diagnosis not present

## 2014-10-29 DIAGNOSIS — N2581 Secondary hyperparathyroidism of renal origin: Secondary | ICD-10-CM | POA: Diagnosis not present

## 2014-10-29 DIAGNOSIS — N186 End stage renal disease: Secondary | ICD-10-CM | POA: Diagnosis not present

## 2014-10-29 DIAGNOSIS — D509 Iron deficiency anemia, unspecified: Secondary | ICD-10-CM | POA: Diagnosis not present

## 2014-10-29 DIAGNOSIS — D631 Anemia in chronic kidney disease: Secondary | ICD-10-CM | POA: Diagnosis not present

## 2014-10-31 DIAGNOSIS — D631 Anemia in chronic kidney disease: Secondary | ICD-10-CM | POA: Diagnosis not present

## 2014-10-31 DIAGNOSIS — D509 Iron deficiency anemia, unspecified: Secondary | ICD-10-CM | POA: Diagnosis not present

## 2014-10-31 DIAGNOSIS — N186 End stage renal disease: Secondary | ICD-10-CM | POA: Diagnosis not present

## 2014-10-31 DIAGNOSIS — N2581 Secondary hyperparathyroidism of renal origin: Secondary | ICD-10-CM | POA: Diagnosis not present

## 2014-11-02 DIAGNOSIS — N186 End stage renal disease: Secondary | ICD-10-CM | POA: Diagnosis not present

## 2014-11-02 DIAGNOSIS — D509 Iron deficiency anemia, unspecified: Secondary | ICD-10-CM | POA: Diagnosis not present

## 2014-11-02 DIAGNOSIS — D631 Anemia in chronic kidney disease: Secondary | ICD-10-CM | POA: Diagnosis not present

## 2014-11-02 DIAGNOSIS — N2581 Secondary hyperparathyroidism of renal origin: Secondary | ICD-10-CM | POA: Diagnosis not present

## 2014-11-04 DIAGNOSIS — T82858A Stenosis of vascular prosthetic devices, implants and grafts, initial encounter: Secondary | ICD-10-CM | POA: Diagnosis not present

## 2014-11-04 DIAGNOSIS — N186 End stage renal disease: Secondary | ICD-10-CM | POA: Diagnosis not present

## 2014-11-05 DIAGNOSIS — N2581 Secondary hyperparathyroidism of renal origin: Secondary | ICD-10-CM | POA: Diagnosis not present

## 2014-11-05 DIAGNOSIS — N186 End stage renal disease: Secondary | ICD-10-CM | POA: Diagnosis not present

## 2014-11-05 DIAGNOSIS — D631 Anemia in chronic kidney disease: Secondary | ICD-10-CM | POA: Diagnosis not present

## 2014-11-05 DIAGNOSIS — D509 Iron deficiency anemia, unspecified: Secondary | ICD-10-CM | POA: Diagnosis not present

## 2014-11-07 DIAGNOSIS — N186 End stage renal disease: Secondary | ICD-10-CM | POA: Diagnosis not present

## 2014-11-07 DIAGNOSIS — N2581 Secondary hyperparathyroidism of renal origin: Secondary | ICD-10-CM | POA: Diagnosis not present

## 2014-11-07 DIAGNOSIS — D631 Anemia in chronic kidney disease: Secondary | ICD-10-CM | POA: Diagnosis not present

## 2014-11-07 DIAGNOSIS — D509 Iron deficiency anemia, unspecified: Secondary | ICD-10-CM | POA: Diagnosis not present

## 2014-11-09 DIAGNOSIS — D509 Iron deficiency anemia, unspecified: Secondary | ICD-10-CM | POA: Diagnosis not present

## 2014-11-09 DIAGNOSIS — N186 End stage renal disease: Secondary | ICD-10-CM | POA: Diagnosis not present

## 2014-11-09 DIAGNOSIS — N2581 Secondary hyperparathyroidism of renal origin: Secondary | ICD-10-CM | POA: Diagnosis not present

## 2014-11-09 DIAGNOSIS — D631 Anemia in chronic kidney disease: Secondary | ICD-10-CM | POA: Diagnosis not present

## 2014-11-12 DIAGNOSIS — N2581 Secondary hyperparathyroidism of renal origin: Secondary | ICD-10-CM | POA: Diagnosis not present

## 2014-11-12 DIAGNOSIS — D631 Anemia in chronic kidney disease: Secondary | ICD-10-CM | POA: Diagnosis not present

## 2014-11-12 DIAGNOSIS — N186 End stage renal disease: Secondary | ICD-10-CM | POA: Diagnosis not present

## 2014-11-12 DIAGNOSIS — D509 Iron deficiency anemia, unspecified: Secondary | ICD-10-CM | POA: Diagnosis not present

## 2014-11-14 DIAGNOSIS — D509 Iron deficiency anemia, unspecified: Secondary | ICD-10-CM | POA: Diagnosis not present

## 2014-11-14 DIAGNOSIS — D631 Anemia in chronic kidney disease: Secondary | ICD-10-CM | POA: Diagnosis not present

## 2014-11-14 DIAGNOSIS — N186 End stage renal disease: Secondary | ICD-10-CM | POA: Diagnosis not present

## 2014-11-14 DIAGNOSIS — N2581 Secondary hyperparathyroidism of renal origin: Secondary | ICD-10-CM | POA: Diagnosis not present

## 2014-11-16 DIAGNOSIS — D509 Iron deficiency anemia, unspecified: Secondary | ICD-10-CM | POA: Diagnosis not present

## 2014-11-16 DIAGNOSIS — D631 Anemia in chronic kidney disease: Secondary | ICD-10-CM | POA: Diagnosis not present

## 2014-11-16 DIAGNOSIS — N2581 Secondary hyperparathyroidism of renal origin: Secondary | ICD-10-CM | POA: Diagnosis not present

## 2014-11-16 DIAGNOSIS — N186 End stage renal disease: Secondary | ICD-10-CM | POA: Diagnosis not present

## 2014-11-19 DIAGNOSIS — D509 Iron deficiency anemia, unspecified: Secondary | ICD-10-CM | POA: Diagnosis not present

## 2014-11-19 DIAGNOSIS — D631 Anemia in chronic kidney disease: Secondary | ICD-10-CM | POA: Diagnosis not present

## 2014-11-19 DIAGNOSIS — N2581 Secondary hyperparathyroidism of renal origin: Secondary | ICD-10-CM | POA: Diagnosis not present

## 2014-11-19 DIAGNOSIS — N186 End stage renal disease: Secondary | ICD-10-CM | POA: Diagnosis not present

## 2014-11-21 DIAGNOSIS — D631 Anemia in chronic kidney disease: Secondary | ICD-10-CM | POA: Diagnosis not present

## 2014-11-21 DIAGNOSIS — D509 Iron deficiency anemia, unspecified: Secondary | ICD-10-CM | POA: Diagnosis not present

## 2014-11-21 DIAGNOSIS — N186 End stage renal disease: Secondary | ICD-10-CM | POA: Diagnosis not present

## 2014-11-21 DIAGNOSIS — N2581 Secondary hyperparathyroidism of renal origin: Secondary | ICD-10-CM | POA: Diagnosis not present

## 2014-11-23 DIAGNOSIS — N186 End stage renal disease: Secondary | ICD-10-CM | POA: Diagnosis not present

## 2014-11-23 DIAGNOSIS — N2581 Secondary hyperparathyroidism of renal origin: Secondary | ICD-10-CM | POA: Diagnosis not present

## 2014-11-23 DIAGNOSIS — D509 Iron deficiency anemia, unspecified: Secondary | ICD-10-CM | POA: Diagnosis not present

## 2014-11-23 DIAGNOSIS — I120 Hypertensive chronic kidney disease with stage 5 chronic kidney disease or end stage renal disease: Secondary | ICD-10-CM | POA: Diagnosis not present

## 2014-11-23 DIAGNOSIS — D631 Anemia in chronic kidney disease: Secondary | ICD-10-CM | POA: Diagnosis not present

## 2014-11-23 DIAGNOSIS — Z992 Dependence on renal dialysis: Secondary | ICD-10-CM | POA: Diagnosis not present

## 2014-11-26 DIAGNOSIS — N2581 Secondary hyperparathyroidism of renal origin: Secondary | ICD-10-CM | POA: Diagnosis not present

## 2014-11-26 DIAGNOSIS — N186 End stage renal disease: Secondary | ICD-10-CM | POA: Diagnosis not present

## 2014-11-26 DIAGNOSIS — D631 Anemia in chronic kidney disease: Secondary | ICD-10-CM | POA: Diagnosis not present

## 2014-11-28 DIAGNOSIS — D631 Anemia in chronic kidney disease: Secondary | ICD-10-CM | POA: Diagnosis not present

## 2014-11-28 DIAGNOSIS — N186 End stage renal disease: Secondary | ICD-10-CM | POA: Diagnosis not present

## 2014-11-28 DIAGNOSIS — N2581 Secondary hyperparathyroidism of renal origin: Secondary | ICD-10-CM | POA: Diagnosis not present

## 2014-11-30 DIAGNOSIS — N2581 Secondary hyperparathyroidism of renal origin: Secondary | ICD-10-CM | POA: Diagnosis not present

## 2014-11-30 DIAGNOSIS — N186 End stage renal disease: Secondary | ICD-10-CM | POA: Diagnosis not present

## 2014-11-30 DIAGNOSIS — D631 Anemia in chronic kidney disease: Secondary | ICD-10-CM | POA: Diagnosis not present

## 2014-12-03 DIAGNOSIS — D631 Anemia in chronic kidney disease: Secondary | ICD-10-CM | POA: Diagnosis not present

## 2014-12-03 DIAGNOSIS — N2581 Secondary hyperparathyroidism of renal origin: Secondary | ICD-10-CM | POA: Diagnosis not present

## 2014-12-03 DIAGNOSIS — N186 End stage renal disease: Secondary | ICD-10-CM | POA: Diagnosis not present

## 2014-12-05 DIAGNOSIS — D631 Anemia in chronic kidney disease: Secondary | ICD-10-CM | POA: Diagnosis not present

## 2014-12-05 DIAGNOSIS — N186 End stage renal disease: Secondary | ICD-10-CM | POA: Diagnosis not present

## 2014-12-05 DIAGNOSIS — N2581 Secondary hyperparathyroidism of renal origin: Secondary | ICD-10-CM | POA: Diagnosis not present

## 2014-12-07 DIAGNOSIS — N186 End stage renal disease: Secondary | ICD-10-CM | POA: Diagnosis not present

## 2014-12-07 DIAGNOSIS — D631 Anemia in chronic kidney disease: Secondary | ICD-10-CM | POA: Diagnosis not present

## 2014-12-07 DIAGNOSIS — N2581 Secondary hyperparathyroidism of renal origin: Secondary | ICD-10-CM | POA: Diagnosis not present

## 2014-12-10 DIAGNOSIS — D631 Anemia in chronic kidney disease: Secondary | ICD-10-CM | POA: Diagnosis not present

## 2014-12-10 DIAGNOSIS — N2581 Secondary hyperparathyroidism of renal origin: Secondary | ICD-10-CM | POA: Diagnosis not present

## 2014-12-10 DIAGNOSIS — N186 End stage renal disease: Secondary | ICD-10-CM | POA: Diagnosis not present

## 2014-12-12 DIAGNOSIS — N186 End stage renal disease: Secondary | ICD-10-CM | POA: Diagnosis not present

## 2014-12-12 DIAGNOSIS — N2581 Secondary hyperparathyroidism of renal origin: Secondary | ICD-10-CM | POA: Diagnosis not present

## 2014-12-12 DIAGNOSIS — D631 Anemia in chronic kidney disease: Secondary | ICD-10-CM | POA: Diagnosis not present

## 2014-12-14 DIAGNOSIS — N2581 Secondary hyperparathyroidism of renal origin: Secondary | ICD-10-CM | POA: Diagnosis not present

## 2014-12-14 DIAGNOSIS — N186 End stage renal disease: Secondary | ICD-10-CM | POA: Diagnosis not present

## 2014-12-14 DIAGNOSIS — D631 Anemia in chronic kidney disease: Secondary | ICD-10-CM | POA: Diagnosis not present

## 2014-12-17 DIAGNOSIS — N186 End stage renal disease: Secondary | ICD-10-CM | POA: Diagnosis not present

## 2014-12-17 DIAGNOSIS — D631 Anemia in chronic kidney disease: Secondary | ICD-10-CM | POA: Diagnosis not present

## 2014-12-17 DIAGNOSIS — N2581 Secondary hyperparathyroidism of renal origin: Secondary | ICD-10-CM | POA: Diagnosis not present

## 2014-12-19 DIAGNOSIS — D631 Anemia in chronic kidney disease: Secondary | ICD-10-CM | POA: Diagnosis not present

## 2014-12-19 DIAGNOSIS — N186 End stage renal disease: Secondary | ICD-10-CM | POA: Diagnosis not present

## 2014-12-19 DIAGNOSIS — N2581 Secondary hyperparathyroidism of renal origin: Secondary | ICD-10-CM | POA: Diagnosis not present

## 2014-12-21 DIAGNOSIS — D631 Anemia in chronic kidney disease: Secondary | ICD-10-CM | POA: Diagnosis not present

## 2014-12-21 DIAGNOSIS — N186 End stage renal disease: Secondary | ICD-10-CM | POA: Diagnosis not present

## 2014-12-21 DIAGNOSIS — N2581 Secondary hyperparathyroidism of renal origin: Secondary | ICD-10-CM | POA: Diagnosis not present

## 2014-12-24 DIAGNOSIS — Z992 Dependence on renal dialysis: Secondary | ICD-10-CM | POA: Diagnosis not present

## 2014-12-24 DIAGNOSIS — N186 End stage renal disease: Secondary | ICD-10-CM | POA: Diagnosis not present

## 2014-12-24 DIAGNOSIS — N2581 Secondary hyperparathyroidism of renal origin: Secondary | ICD-10-CM | POA: Diagnosis not present

## 2014-12-24 DIAGNOSIS — D631 Anemia in chronic kidney disease: Secondary | ICD-10-CM | POA: Diagnosis not present

## 2014-12-24 DIAGNOSIS — I120 Hypertensive chronic kidney disease with stage 5 chronic kidney disease or end stage renal disease: Secondary | ICD-10-CM | POA: Diagnosis not present

## 2014-12-26 DIAGNOSIS — D631 Anemia in chronic kidney disease: Secondary | ICD-10-CM | POA: Diagnosis not present

## 2014-12-26 DIAGNOSIS — N186 End stage renal disease: Secondary | ICD-10-CM | POA: Diagnosis not present

## 2014-12-26 DIAGNOSIS — N2581 Secondary hyperparathyroidism of renal origin: Secondary | ICD-10-CM | POA: Diagnosis not present

## 2014-12-27 DIAGNOSIS — Z6826 Body mass index (BMI) 26.0-26.9, adult: Secondary | ICD-10-CM | POA: Diagnosis not present

## 2014-12-27 DIAGNOSIS — M549 Dorsalgia, unspecified: Secondary | ICD-10-CM | POA: Diagnosis not present

## 2014-12-27 DIAGNOSIS — I129 Hypertensive chronic kidney disease with stage 1 through stage 4 chronic kidney disease, or unspecified chronic kidney disease: Secondary | ICD-10-CM | POA: Diagnosis not present

## 2014-12-28 DIAGNOSIS — N2581 Secondary hyperparathyroidism of renal origin: Secondary | ICD-10-CM | POA: Diagnosis not present

## 2014-12-28 DIAGNOSIS — D631 Anemia in chronic kidney disease: Secondary | ICD-10-CM | POA: Diagnosis not present

## 2014-12-28 DIAGNOSIS — N186 End stage renal disease: Secondary | ICD-10-CM | POA: Diagnosis not present

## 2014-12-31 DIAGNOSIS — N2581 Secondary hyperparathyroidism of renal origin: Secondary | ICD-10-CM | POA: Diagnosis not present

## 2014-12-31 DIAGNOSIS — D631 Anemia in chronic kidney disease: Secondary | ICD-10-CM | POA: Diagnosis not present

## 2014-12-31 DIAGNOSIS — N186 End stage renal disease: Secondary | ICD-10-CM | POA: Diagnosis not present

## 2015-01-02 DIAGNOSIS — N186 End stage renal disease: Secondary | ICD-10-CM | POA: Diagnosis not present

## 2015-01-02 DIAGNOSIS — N2581 Secondary hyperparathyroidism of renal origin: Secondary | ICD-10-CM | POA: Diagnosis not present

## 2015-01-02 DIAGNOSIS — D631 Anemia in chronic kidney disease: Secondary | ICD-10-CM | POA: Diagnosis not present

## 2015-01-04 DIAGNOSIS — D631 Anemia in chronic kidney disease: Secondary | ICD-10-CM | POA: Diagnosis not present

## 2015-01-04 DIAGNOSIS — N2581 Secondary hyperparathyroidism of renal origin: Secondary | ICD-10-CM | POA: Diagnosis not present

## 2015-01-04 DIAGNOSIS — N186 End stage renal disease: Secondary | ICD-10-CM | POA: Diagnosis not present

## 2015-01-07 DIAGNOSIS — D631 Anemia in chronic kidney disease: Secondary | ICD-10-CM | POA: Diagnosis not present

## 2015-01-07 DIAGNOSIS — N186 End stage renal disease: Secondary | ICD-10-CM | POA: Diagnosis not present

## 2015-01-07 DIAGNOSIS — N2581 Secondary hyperparathyroidism of renal origin: Secondary | ICD-10-CM | POA: Diagnosis not present

## 2015-01-09 DIAGNOSIS — N2581 Secondary hyperparathyroidism of renal origin: Secondary | ICD-10-CM | POA: Diagnosis not present

## 2015-01-09 DIAGNOSIS — D631 Anemia in chronic kidney disease: Secondary | ICD-10-CM | POA: Diagnosis not present

## 2015-01-09 DIAGNOSIS — N186 End stage renal disease: Secondary | ICD-10-CM | POA: Diagnosis not present

## 2015-01-11 DIAGNOSIS — N186 End stage renal disease: Secondary | ICD-10-CM | POA: Diagnosis not present

## 2015-01-11 DIAGNOSIS — N2581 Secondary hyperparathyroidism of renal origin: Secondary | ICD-10-CM | POA: Diagnosis not present

## 2015-01-11 DIAGNOSIS — D631 Anemia in chronic kidney disease: Secondary | ICD-10-CM | POA: Diagnosis not present

## 2015-01-14 DIAGNOSIS — N186 End stage renal disease: Secondary | ICD-10-CM | POA: Diagnosis not present

## 2015-01-14 DIAGNOSIS — N2581 Secondary hyperparathyroidism of renal origin: Secondary | ICD-10-CM | POA: Diagnosis not present

## 2015-01-14 DIAGNOSIS — D631 Anemia in chronic kidney disease: Secondary | ICD-10-CM | POA: Diagnosis not present

## 2015-01-16 DIAGNOSIS — D631 Anemia in chronic kidney disease: Secondary | ICD-10-CM | POA: Diagnosis not present

## 2015-01-16 DIAGNOSIS — N186 End stage renal disease: Secondary | ICD-10-CM | POA: Diagnosis not present

## 2015-01-16 DIAGNOSIS — N2581 Secondary hyperparathyroidism of renal origin: Secondary | ICD-10-CM | POA: Diagnosis not present

## 2015-01-18 DIAGNOSIS — N2581 Secondary hyperparathyroidism of renal origin: Secondary | ICD-10-CM | POA: Diagnosis not present

## 2015-01-18 DIAGNOSIS — N186 End stage renal disease: Secondary | ICD-10-CM | POA: Diagnosis not present

## 2015-01-18 DIAGNOSIS — D631 Anemia in chronic kidney disease: Secondary | ICD-10-CM | POA: Diagnosis not present

## 2015-01-21 DIAGNOSIS — D631 Anemia in chronic kidney disease: Secondary | ICD-10-CM | POA: Diagnosis not present

## 2015-01-21 DIAGNOSIS — N2581 Secondary hyperparathyroidism of renal origin: Secondary | ICD-10-CM | POA: Diagnosis not present

## 2015-01-21 DIAGNOSIS — N186 End stage renal disease: Secondary | ICD-10-CM | POA: Diagnosis not present

## 2015-01-23 DIAGNOSIS — N186 End stage renal disease: Secondary | ICD-10-CM | POA: Diagnosis not present

## 2015-01-23 DIAGNOSIS — Z992 Dependence on renal dialysis: Secondary | ICD-10-CM | POA: Diagnosis not present

## 2015-01-23 DIAGNOSIS — D631 Anemia in chronic kidney disease: Secondary | ICD-10-CM | POA: Diagnosis not present

## 2015-01-23 DIAGNOSIS — N2581 Secondary hyperparathyroidism of renal origin: Secondary | ICD-10-CM | POA: Diagnosis not present

## 2015-01-23 DIAGNOSIS — I120 Hypertensive chronic kidney disease with stage 5 chronic kidney disease or end stage renal disease: Secondary | ICD-10-CM | POA: Diagnosis not present

## 2015-01-25 DIAGNOSIS — N186 End stage renal disease: Secondary | ICD-10-CM | POA: Diagnosis not present

## 2015-01-25 DIAGNOSIS — N2581 Secondary hyperparathyroidism of renal origin: Secondary | ICD-10-CM | POA: Diagnosis not present

## 2015-01-25 DIAGNOSIS — D631 Anemia in chronic kidney disease: Secondary | ICD-10-CM | POA: Diagnosis not present

## 2015-01-28 DIAGNOSIS — D631 Anemia in chronic kidney disease: Secondary | ICD-10-CM | POA: Diagnosis not present

## 2015-01-28 DIAGNOSIS — N2581 Secondary hyperparathyroidism of renal origin: Secondary | ICD-10-CM | POA: Diagnosis not present

## 2015-01-28 DIAGNOSIS — N186 End stage renal disease: Secondary | ICD-10-CM | POA: Diagnosis not present

## 2015-01-30 DIAGNOSIS — D631 Anemia in chronic kidney disease: Secondary | ICD-10-CM | POA: Diagnosis not present

## 2015-01-30 DIAGNOSIS — N186 End stage renal disease: Secondary | ICD-10-CM | POA: Diagnosis not present

## 2015-01-30 DIAGNOSIS — N2581 Secondary hyperparathyroidism of renal origin: Secondary | ICD-10-CM | POA: Diagnosis not present

## 2015-01-31 DIAGNOSIS — Z6827 Body mass index (BMI) 27.0-27.9, adult: Secondary | ICD-10-CM | POA: Diagnosis not present

## 2015-01-31 DIAGNOSIS — M549 Dorsalgia, unspecified: Secondary | ICD-10-CM | POA: Diagnosis not present

## 2015-02-01 DIAGNOSIS — D631 Anemia in chronic kidney disease: Secondary | ICD-10-CM | POA: Diagnosis not present

## 2015-02-01 DIAGNOSIS — N2581 Secondary hyperparathyroidism of renal origin: Secondary | ICD-10-CM | POA: Diagnosis not present

## 2015-02-01 DIAGNOSIS — N186 End stage renal disease: Secondary | ICD-10-CM | POA: Diagnosis not present

## 2015-02-03 DIAGNOSIS — N186 End stage renal disease: Secondary | ICD-10-CM | POA: Diagnosis not present

## 2015-02-03 DIAGNOSIS — D631 Anemia in chronic kidney disease: Secondary | ICD-10-CM | POA: Diagnosis not present

## 2015-02-03 DIAGNOSIS — N2581 Secondary hyperparathyroidism of renal origin: Secondary | ICD-10-CM | POA: Diagnosis not present

## 2015-02-06 DIAGNOSIS — N2581 Secondary hyperparathyroidism of renal origin: Secondary | ICD-10-CM | POA: Diagnosis not present

## 2015-02-06 DIAGNOSIS — N186 End stage renal disease: Secondary | ICD-10-CM | POA: Diagnosis not present

## 2015-02-06 DIAGNOSIS — D631 Anemia in chronic kidney disease: Secondary | ICD-10-CM | POA: Diagnosis not present

## 2015-02-08 DIAGNOSIS — N186 End stage renal disease: Secondary | ICD-10-CM | POA: Diagnosis not present

## 2015-02-08 DIAGNOSIS — D631 Anemia in chronic kidney disease: Secondary | ICD-10-CM | POA: Diagnosis not present

## 2015-02-08 DIAGNOSIS — N2581 Secondary hyperparathyroidism of renal origin: Secondary | ICD-10-CM | POA: Diagnosis not present

## 2015-02-11 DIAGNOSIS — N186 End stage renal disease: Secondary | ICD-10-CM | POA: Diagnosis not present

## 2015-02-11 DIAGNOSIS — N2581 Secondary hyperparathyroidism of renal origin: Secondary | ICD-10-CM | POA: Diagnosis not present

## 2015-02-11 DIAGNOSIS — D631 Anemia in chronic kidney disease: Secondary | ICD-10-CM | POA: Diagnosis not present

## 2015-02-13 DIAGNOSIS — N2581 Secondary hyperparathyroidism of renal origin: Secondary | ICD-10-CM | POA: Diagnosis not present

## 2015-02-13 DIAGNOSIS — N186 End stage renal disease: Secondary | ICD-10-CM | POA: Diagnosis not present

## 2015-02-13 DIAGNOSIS — D631 Anemia in chronic kidney disease: Secondary | ICD-10-CM | POA: Diagnosis not present

## 2015-02-14 DIAGNOSIS — H3532 Exudative age-related macular degeneration: Secondary | ICD-10-CM | POA: Diagnosis not present

## 2015-02-15 DIAGNOSIS — N186 End stage renal disease: Secondary | ICD-10-CM | POA: Diagnosis not present

## 2015-02-15 DIAGNOSIS — N2581 Secondary hyperparathyroidism of renal origin: Secondary | ICD-10-CM | POA: Diagnosis not present

## 2015-02-15 DIAGNOSIS — D631 Anemia in chronic kidney disease: Secondary | ICD-10-CM | POA: Diagnosis not present

## 2015-02-18 DIAGNOSIS — D631 Anemia in chronic kidney disease: Secondary | ICD-10-CM | POA: Diagnosis not present

## 2015-02-18 DIAGNOSIS — N186 End stage renal disease: Secondary | ICD-10-CM | POA: Diagnosis not present

## 2015-02-18 DIAGNOSIS — N2581 Secondary hyperparathyroidism of renal origin: Secondary | ICD-10-CM | POA: Diagnosis not present

## 2015-02-20 DIAGNOSIS — D631 Anemia in chronic kidney disease: Secondary | ICD-10-CM | POA: Diagnosis not present

## 2015-02-20 DIAGNOSIS — N2581 Secondary hyperparathyroidism of renal origin: Secondary | ICD-10-CM | POA: Diagnosis not present

## 2015-02-20 DIAGNOSIS — N186 End stage renal disease: Secondary | ICD-10-CM | POA: Diagnosis not present

## 2015-02-22 DIAGNOSIS — N186 End stage renal disease: Secondary | ICD-10-CM | POA: Diagnosis not present

## 2015-02-22 DIAGNOSIS — N2581 Secondary hyperparathyroidism of renal origin: Secondary | ICD-10-CM | POA: Diagnosis not present

## 2015-02-22 DIAGNOSIS — D631 Anemia in chronic kidney disease: Secondary | ICD-10-CM | POA: Diagnosis not present

## 2015-02-23 DIAGNOSIS — Z992 Dependence on renal dialysis: Secondary | ICD-10-CM | POA: Diagnosis not present

## 2015-02-23 DIAGNOSIS — I120 Hypertensive chronic kidney disease with stage 5 chronic kidney disease or end stage renal disease: Secondary | ICD-10-CM | POA: Diagnosis not present

## 2015-02-23 DIAGNOSIS — N186 End stage renal disease: Secondary | ICD-10-CM | POA: Diagnosis not present

## 2015-02-25 DIAGNOSIS — N2581 Secondary hyperparathyroidism of renal origin: Secondary | ICD-10-CM | POA: Diagnosis not present

## 2015-02-25 DIAGNOSIS — D631 Anemia in chronic kidney disease: Secondary | ICD-10-CM | POA: Diagnosis not present

## 2015-02-25 DIAGNOSIS — N186 End stage renal disease: Secondary | ICD-10-CM | POA: Diagnosis not present

## 2015-02-27 DIAGNOSIS — H3532 Exudative age-related macular degeneration: Secondary | ICD-10-CM | POA: Diagnosis not present

## 2015-02-27 DIAGNOSIS — D631 Anemia in chronic kidney disease: Secondary | ICD-10-CM | POA: Diagnosis not present

## 2015-02-27 DIAGNOSIS — N2581 Secondary hyperparathyroidism of renal origin: Secondary | ICD-10-CM | POA: Diagnosis not present

## 2015-02-27 DIAGNOSIS — N186 End stage renal disease: Secondary | ICD-10-CM | POA: Diagnosis not present

## 2015-03-01 DIAGNOSIS — D631 Anemia in chronic kidney disease: Secondary | ICD-10-CM | POA: Diagnosis not present

## 2015-03-01 DIAGNOSIS — N2581 Secondary hyperparathyroidism of renal origin: Secondary | ICD-10-CM | POA: Diagnosis not present

## 2015-03-01 DIAGNOSIS — N186 End stage renal disease: Secondary | ICD-10-CM | POA: Diagnosis not present

## 2015-03-04 DIAGNOSIS — D631 Anemia in chronic kidney disease: Secondary | ICD-10-CM | POA: Diagnosis not present

## 2015-03-04 DIAGNOSIS — N2581 Secondary hyperparathyroidism of renal origin: Secondary | ICD-10-CM | POA: Diagnosis not present

## 2015-03-04 DIAGNOSIS — N186 End stage renal disease: Secondary | ICD-10-CM | POA: Diagnosis not present

## 2015-03-06 DIAGNOSIS — D631 Anemia in chronic kidney disease: Secondary | ICD-10-CM | POA: Diagnosis not present

## 2015-03-06 DIAGNOSIS — N2581 Secondary hyperparathyroidism of renal origin: Secondary | ICD-10-CM | POA: Diagnosis not present

## 2015-03-06 DIAGNOSIS — N186 End stage renal disease: Secondary | ICD-10-CM | POA: Diagnosis not present

## 2015-03-07 DIAGNOSIS — M549 Dorsalgia, unspecified: Secondary | ICD-10-CM | POA: Diagnosis not present

## 2015-03-07 DIAGNOSIS — I129 Hypertensive chronic kidney disease with stage 1 through stage 4 chronic kidney disease, or unspecified chronic kidney disease: Secondary | ICD-10-CM | POA: Diagnosis not present

## 2015-03-07 DIAGNOSIS — Z6826 Body mass index (BMI) 26.0-26.9, adult: Secondary | ICD-10-CM | POA: Diagnosis not present

## 2015-03-08 DIAGNOSIS — D631 Anemia in chronic kidney disease: Secondary | ICD-10-CM | POA: Diagnosis not present

## 2015-03-08 DIAGNOSIS — N186 End stage renal disease: Secondary | ICD-10-CM | POA: Diagnosis not present

## 2015-03-08 DIAGNOSIS — N2581 Secondary hyperparathyroidism of renal origin: Secondary | ICD-10-CM | POA: Diagnosis not present

## 2015-03-11 DIAGNOSIS — N2581 Secondary hyperparathyroidism of renal origin: Secondary | ICD-10-CM | POA: Diagnosis not present

## 2015-03-11 DIAGNOSIS — D631 Anemia in chronic kidney disease: Secondary | ICD-10-CM | POA: Diagnosis not present

## 2015-03-11 DIAGNOSIS — N186 End stage renal disease: Secondary | ICD-10-CM | POA: Diagnosis not present

## 2015-03-13 DIAGNOSIS — N186 End stage renal disease: Secondary | ICD-10-CM | POA: Diagnosis not present

## 2015-03-13 DIAGNOSIS — N2581 Secondary hyperparathyroidism of renal origin: Secondary | ICD-10-CM | POA: Diagnosis not present

## 2015-03-13 DIAGNOSIS — D631 Anemia in chronic kidney disease: Secondary | ICD-10-CM | POA: Diagnosis not present

## 2015-03-15 DIAGNOSIS — N186 End stage renal disease: Secondary | ICD-10-CM | POA: Diagnosis not present

## 2015-03-15 DIAGNOSIS — N2581 Secondary hyperparathyroidism of renal origin: Secondary | ICD-10-CM | POA: Diagnosis not present

## 2015-03-15 DIAGNOSIS — D631 Anemia in chronic kidney disease: Secondary | ICD-10-CM | POA: Diagnosis not present

## 2015-03-18 DIAGNOSIS — J449 Chronic obstructive pulmonary disease, unspecified: Secondary | ICD-10-CM | POA: Diagnosis not present

## 2015-03-18 DIAGNOSIS — N2581 Secondary hyperparathyroidism of renal origin: Secondary | ICD-10-CM | POA: Diagnosis not present

## 2015-03-18 DIAGNOSIS — R531 Weakness: Secondary | ICD-10-CM | POA: Diagnosis not present

## 2015-03-18 DIAGNOSIS — Z951 Presence of aortocoronary bypass graft: Secondary | ICD-10-CM | POA: Diagnosis not present

## 2015-03-18 DIAGNOSIS — D631 Anemia in chronic kidney disease: Secondary | ICD-10-CM | POA: Diagnosis not present

## 2015-03-18 DIAGNOSIS — R0602 Shortness of breath: Secondary | ICD-10-CM | POA: Diagnosis not present

## 2015-03-18 DIAGNOSIS — I517 Cardiomegaly: Secondary | ICD-10-CM | POA: Diagnosis not present

## 2015-03-18 DIAGNOSIS — I2510 Atherosclerotic heart disease of native coronary artery without angina pectoris: Secondary | ICD-10-CM | POA: Diagnosis not present

## 2015-03-18 DIAGNOSIS — Z79899 Other long term (current) drug therapy: Secondary | ICD-10-CM | POA: Diagnosis not present

## 2015-03-18 DIAGNOSIS — Z87891 Personal history of nicotine dependence: Secondary | ICD-10-CM | POA: Diagnosis not present

## 2015-03-18 DIAGNOSIS — N186 End stage renal disease: Secondary | ICD-10-CM | POA: Diagnosis not present

## 2015-03-18 DIAGNOSIS — R0989 Other specified symptoms and signs involving the circulatory and respiratory systems: Secondary | ICD-10-CM | POA: Diagnosis not present

## 2015-03-18 DIAGNOSIS — I10 Essential (primary) hypertension: Secondary | ICD-10-CM | POA: Diagnosis not present

## 2015-03-20 DIAGNOSIS — N186 End stage renal disease: Secondary | ICD-10-CM | POA: Diagnosis not present

## 2015-03-20 DIAGNOSIS — N2581 Secondary hyperparathyroidism of renal origin: Secondary | ICD-10-CM | POA: Diagnosis not present

## 2015-03-20 DIAGNOSIS — D631 Anemia in chronic kidney disease: Secondary | ICD-10-CM | POA: Diagnosis not present

## 2015-03-22 DIAGNOSIS — N2581 Secondary hyperparathyroidism of renal origin: Secondary | ICD-10-CM | POA: Diagnosis not present

## 2015-03-22 DIAGNOSIS — N186 End stage renal disease: Secondary | ICD-10-CM | POA: Diagnosis not present

## 2015-03-22 DIAGNOSIS — D631 Anemia in chronic kidney disease: Secondary | ICD-10-CM | POA: Diagnosis not present

## 2015-03-25 DIAGNOSIS — N186 End stage renal disease: Secondary | ICD-10-CM | POA: Diagnosis not present

## 2015-03-25 DIAGNOSIS — D631 Anemia in chronic kidney disease: Secondary | ICD-10-CM | POA: Diagnosis not present

## 2015-03-25 DIAGNOSIS — N2581 Secondary hyperparathyroidism of renal origin: Secondary | ICD-10-CM | POA: Diagnosis not present

## 2015-03-26 DIAGNOSIS — Z992 Dependence on renal dialysis: Secondary | ICD-10-CM | POA: Diagnosis not present

## 2015-03-26 DIAGNOSIS — N186 End stage renal disease: Secondary | ICD-10-CM | POA: Diagnosis not present

## 2015-03-26 DIAGNOSIS — I120 Hypertensive chronic kidney disease with stage 5 chronic kidney disease or end stage renal disease: Secondary | ICD-10-CM | POA: Diagnosis not present

## 2015-03-27 DIAGNOSIS — N2581 Secondary hyperparathyroidism of renal origin: Secondary | ICD-10-CM | POA: Diagnosis not present

## 2015-03-27 DIAGNOSIS — N186 End stage renal disease: Secondary | ICD-10-CM | POA: Diagnosis not present

## 2015-03-27 DIAGNOSIS — D631 Anemia in chronic kidney disease: Secondary | ICD-10-CM | POA: Diagnosis not present

## 2015-03-29 DIAGNOSIS — D631 Anemia in chronic kidney disease: Secondary | ICD-10-CM | POA: Diagnosis not present

## 2015-03-29 DIAGNOSIS — N186 End stage renal disease: Secondary | ICD-10-CM | POA: Diagnosis not present

## 2015-03-29 DIAGNOSIS — N2581 Secondary hyperparathyroidism of renal origin: Secondary | ICD-10-CM | POA: Diagnosis not present

## 2015-04-01 DIAGNOSIS — N2581 Secondary hyperparathyroidism of renal origin: Secondary | ICD-10-CM | POA: Diagnosis not present

## 2015-04-01 DIAGNOSIS — N186 End stage renal disease: Secondary | ICD-10-CM | POA: Diagnosis not present

## 2015-04-01 DIAGNOSIS — D631 Anemia in chronic kidney disease: Secondary | ICD-10-CM | POA: Diagnosis not present

## 2015-04-03 DIAGNOSIS — N2581 Secondary hyperparathyroidism of renal origin: Secondary | ICD-10-CM | POA: Diagnosis not present

## 2015-04-03 DIAGNOSIS — N186 End stage renal disease: Secondary | ICD-10-CM | POA: Diagnosis not present

## 2015-04-03 DIAGNOSIS — D631 Anemia in chronic kidney disease: Secondary | ICD-10-CM | POA: Diagnosis not present

## 2015-04-04 DIAGNOSIS — Z6826 Body mass index (BMI) 26.0-26.9, adult: Secondary | ICD-10-CM | POA: Diagnosis not present

## 2015-04-04 DIAGNOSIS — M545 Low back pain: Secondary | ICD-10-CM | POA: Diagnosis not present

## 2015-04-05 DIAGNOSIS — N186 End stage renal disease: Secondary | ICD-10-CM | POA: Diagnosis not present

## 2015-04-05 DIAGNOSIS — D631 Anemia in chronic kidney disease: Secondary | ICD-10-CM | POA: Diagnosis not present

## 2015-04-05 DIAGNOSIS — N2581 Secondary hyperparathyroidism of renal origin: Secondary | ICD-10-CM | POA: Diagnosis not present

## 2015-04-08 DIAGNOSIS — N186 End stage renal disease: Secondary | ICD-10-CM | POA: Diagnosis not present

## 2015-04-08 DIAGNOSIS — R069 Unspecified abnormalities of breathing: Secondary | ICD-10-CM | POA: Diagnosis not present

## 2015-04-08 DIAGNOSIS — J811 Chronic pulmonary edema: Secondary | ICD-10-CM | POA: Diagnosis not present

## 2015-04-08 DIAGNOSIS — J80 Acute respiratory distress syndrome: Secondary | ICD-10-CM | POA: Diagnosis not present

## 2015-04-08 DIAGNOSIS — I509 Heart failure, unspecified: Secondary | ICD-10-CM | POA: Diagnosis not present

## 2015-04-08 DIAGNOSIS — R0600 Dyspnea, unspecified: Secondary | ICD-10-CM | POA: Diagnosis not present

## 2015-04-08 DIAGNOSIS — Z9114 Patient's other noncompliance with medication regimen: Secondary | ICD-10-CM | POA: Diagnosis not present

## 2015-04-08 DIAGNOSIS — D631 Anemia in chronic kidney disease: Secondary | ICD-10-CM | POA: Diagnosis not present

## 2015-04-08 DIAGNOSIS — R0602 Shortness of breath: Secondary | ICD-10-CM | POA: Diagnosis not present

## 2015-04-08 DIAGNOSIS — I10 Essential (primary) hypertension: Secondary | ICD-10-CM | POA: Diagnosis not present

## 2015-04-08 DIAGNOSIS — N2581 Secondary hyperparathyroidism of renal origin: Secondary | ICD-10-CM | POA: Diagnosis not present

## 2015-04-10 DIAGNOSIS — N186 End stage renal disease: Secondary | ICD-10-CM | POA: Diagnosis not present

## 2015-04-10 DIAGNOSIS — D631 Anemia in chronic kidney disease: Secondary | ICD-10-CM | POA: Diagnosis not present

## 2015-04-10 DIAGNOSIS — N2581 Secondary hyperparathyroidism of renal origin: Secondary | ICD-10-CM | POA: Diagnosis not present

## 2015-04-12 DIAGNOSIS — N186 End stage renal disease: Secondary | ICD-10-CM | POA: Diagnosis not present

## 2015-04-12 DIAGNOSIS — N2581 Secondary hyperparathyroidism of renal origin: Secondary | ICD-10-CM | POA: Diagnosis not present

## 2015-04-12 DIAGNOSIS — D631 Anemia in chronic kidney disease: Secondary | ICD-10-CM | POA: Diagnosis not present

## 2015-04-15 DIAGNOSIS — N186 End stage renal disease: Secondary | ICD-10-CM | POA: Diagnosis not present

## 2015-04-15 DIAGNOSIS — N2581 Secondary hyperparathyroidism of renal origin: Secondary | ICD-10-CM | POA: Diagnosis not present

## 2015-04-15 DIAGNOSIS — D631 Anemia in chronic kidney disease: Secondary | ICD-10-CM | POA: Diagnosis not present

## 2015-04-17 DIAGNOSIS — N2581 Secondary hyperparathyroidism of renal origin: Secondary | ICD-10-CM | POA: Diagnosis not present

## 2015-04-17 DIAGNOSIS — D631 Anemia in chronic kidney disease: Secondary | ICD-10-CM | POA: Diagnosis not present

## 2015-04-17 DIAGNOSIS — N186 End stage renal disease: Secondary | ICD-10-CM | POA: Diagnosis not present

## 2015-04-18 DIAGNOSIS — R6882 Decreased libido: Secondary | ICD-10-CM | POA: Diagnosis not present

## 2015-04-18 DIAGNOSIS — N186 End stage renal disease: Secondary | ICD-10-CM | POA: Diagnosis not present

## 2015-04-18 DIAGNOSIS — N529 Male erectile dysfunction, unspecified: Secondary | ICD-10-CM | POA: Diagnosis not present

## 2015-04-18 DIAGNOSIS — Z720 Tobacco use: Secondary | ICD-10-CM | POA: Diagnosis not present

## 2015-04-19 DIAGNOSIS — N2581 Secondary hyperparathyroidism of renal origin: Secondary | ICD-10-CM | POA: Diagnosis not present

## 2015-04-19 DIAGNOSIS — D631 Anemia in chronic kidney disease: Secondary | ICD-10-CM | POA: Diagnosis not present

## 2015-04-19 DIAGNOSIS — N186 End stage renal disease: Secondary | ICD-10-CM | POA: Diagnosis not present

## 2015-04-22 DIAGNOSIS — D631 Anemia in chronic kidney disease: Secondary | ICD-10-CM | POA: Diagnosis not present

## 2015-04-22 DIAGNOSIS — N186 End stage renal disease: Secondary | ICD-10-CM | POA: Diagnosis not present

## 2015-04-22 DIAGNOSIS — N2581 Secondary hyperparathyroidism of renal origin: Secondary | ICD-10-CM | POA: Diagnosis not present

## 2015-04-24 DIAGNOSIS — N2581 Secondary hyperparathyroidism of renal origin: Secondary | ICD-10-CM | POA: Diagnosis not present

## 2015-04-24 DIAGNOSIS — D631 Anemia in chronic kidney disease: Secondary | ICD-10-CM | POA: Diagnosis not present

## 2015-04-24 DIAGNOSIS — N186 End stage renal disease: Secondary | ICD-10-CM | POA: Diagnosis not present

## 2015-04-25 DIAGNOSIS — N186 End stage renal disease: Secondary | ICD-10-CM | POA: Diagnosis not present

## 2015-04-25 DIAGNOSIS — Z992 Dependence on renal dialysis: Secondary | ICD-10-CM | POA: Diagnosis not present

## 2015-04-25 DIAGNOSIS — I120 Hypertensive chronic kidney disease with stage 5 chronic kidney disease or end stage renal disease: Secondary | ICD-10-CM | POA: Diagnosis not present

## 2015-04-26 DIAGNOSIS — Z23 Encounter for immunization: Secondary | ICD-10-CM | POA: Diagnosis not present

## 2015-04-26 DIAGNOSIS — D631 Anemia in chronic kidney disease: Secondary | ICD-10-CM | POA: Diagnosis not present

## 2015-04-26 DIAGNOSIS — N186 End stage renal disease: Secondary | ICD-10-CM | POA: Diagnosis not present

## 2015-04-26 DIAGNOSIS — N2581 Secondary hyperparathyroidism of renal origin: Secondary | ICD-10-CM | POA: Diagnosis not present

## 2015-04-29 DIAGNOSIS — N2581 Secondary hyperparathyroidism of renal origin: Secondary | ICD-10-CM | POA: Diagnosis not present

## 2015-04-29 DIAGNOSIS — Z23 Encounter for immunization: Secondary | ICD-10-CM | POA: Diagnosis not present

## 2015-04-29 DIAGNOSIS — D631 Anemia in chronic kidney disease: Secondary | ICD-10-CM | POA: Diagnosis not present

## 2015-04-29 DIAGNOSIS — N186 End stage renal disease: Secondary | ICD-10-CM | POA: Diagnosis not present

## 2015-05-01 DIAGNOSIS — Z23 Encounter for immunization: Secondary | ICD-10-CM | POA: Diagnosis not present

## 2015-05-01 DIAGNOSIS — J90 Pleural effusion, not elsewhere classified: Secondary | ICD-10-CM | POA: Diagnosis not present

## 2015-05-01 DIAGNOSIS — R918 Other nonspecific abnormal finding of lung field: Secondary | ICD-10-CM | POA: Diagnosis not present

## 2015-05-01 DIAGNOSIS — D631 Anemia in chronic kidney disease: Secondary | ICD-10-CM | POA: Diagnosis not present

## 2015-05-01 DIAGNOSIS — N186 End stage renal disease: Secondary | ICD-10-CM | POA: Diagnosis not present

## 2015-05-01 DIAGNOSIS — N2581 Secondary hyperparathyroidism of renal origin: Secondary | ICD-10-CM | POA: Diagnosis not present

## 2015-05-03 DIAGNOSIS — Z23 Encounter for immunization: Secondary | ICD-10-CM | POA: Diagnosis not present

## 2015-05-03 DIAGNOSIS — D631 Anemia in chronic kidney disease: Secondary | ICD-10-CM | POA: Diagnosis not present

## 2015-05-03 DIAGNOSIS — N186 End stage renal disease: Secondary | ICD-10-CM | POA: Diagnosis not present

## 2015-05-03 DIAGNOSIS — N2581 Secondary hyperparathyroidism of renal origin: Secondary | ICD-10-CM | POA: Diagnosis not present

## 2015-05-06 DIAGNOSIS — D631 Anemia in chronic kidney disease: Secondary | ICD-10-CM | POA: Diagnosis not present

## 2015-05-06 DIAGNOSIS — N186 End stage renal disease: Secondary | ICD-10-CM | POA: Diagnosis not present

## 2015-05-06 DIAGNOSIS — N2581 Secondary hyperparathyroidism of renal origin: Secondary | ICD-10-CM | POA: Diagnosis not present

## 2015-05-06 DIAGNOSIS — Z23 Encounter for immunization: Secondary | ICD-10-CM | POA: Diagnosis not present

## 2015-05-08 DIAGNOSIS — Z23 Encounter for immunization: Secondary | ICD-10-CM | POA: Diagnosis not present

## 2015-05-08 DIAGNOSIS — D631 Anemia in chronic kidney disease: Secondary | ICD-10-CM | POA: Diagnosis not present

## 2015-05-08 DIAGNOSIS — N186 End stage renal disease: Secondary | ICD-10-CM | POA: Diagnosis not present

## 2015-05-08 DIAGNOSIS — N2581 Secondary hyperparathyroidism of renal origin: Secondary | ICD-10-CM | POA: Diagnosis not present

## 2015-05-10 DIAGNOSIS — Z23 Encounter for immunization: Secondary | ICD-10-CM | POA: Diagnosis not present

## 2015-05-10 DIAGNOSIS — N2581 Secondary hyperparathyroidism of renal origin: Secondary | ICD-10-CM | POA: Diagnosis not present

## 2015-05-10 DIAGNOSIS — N186 End stage renal disease: Secondary | ICD-10-CM | POA: Diagnosis not present

## 2015-05-10 DIAGNOSIS — D631 Anemia in chronic kidney disease: Secondary | ICD-10-CM | POA: Diagnosis not present

## 2015-05-13 DIAGNOSIS — N2581 Secondary hyperparathyroidism of renal origin: Secondary | ICD-10-CM | POA: Diagnosis not present

## 2015-05-13 DIAGNOSIS — N186 End stage renal disease: Secondary | ICD-10-CM | POA: Diagnosis not present

## 2015-05-13 DIAGNOSIS — J90 Pleural effusion, not elsewhere classified: Secondary | ICD-10-CM | POA: Diagnosis not present

## 2015-05-13 DIAGNOSIS — Z886 Allergy status to analgesic agent status: Secondary | ICD-10-CM | POA: Diagnosis not present

## 2015-05-13 DIAGNOSIS — Z23 Encounter for immunization: Secondary | ICD-10-CM | POA: Diagnosis not present

## 2015-05-13 DIAGNOSIS — D631 Anemia in chronic kidney disease: Secondary | ICD-10-CM | POA: Diagnosis not present

## 2015-05-15 DIAGNOSIS — N186 End stage renal disease: Secondary | ICD-10-CM | POA: Diagnosis not present

## 2015-05-15 DIAGNOSIS — N2581 Secondary hyperparathyroidism of renal origin: Secondary | ICD-10-CM | POA: Diagnosis not present

## 2015-05-15 DIAGNOSIS — Z23 Encounter for immunization: Secondary | ICD-10-CM | POA: Diagnosis not present

## 2015-05-15 DIAGNOSIS — D631 Anemia in chronic kidney disease: Secondary | ICD-10-CM | POA: Diagnosis not present

## 2015-05-17 DIAGNOSIS — Z23 Encounter for immunization: Secondary | ICD-10-CM | POA: Diagnosis not present

## 2015-05-17 DIAGNOSIS — N186 End stage renal disease: Secondary | ICD-10-CM | POA: Diagnosis not present

## 2015-05-17 DIAGNOSIS — D631 Anemia in chronic kidney disease: Secondary | ICD-10-CM | POA: Diagnosis not present

## 2015-05-17 DIAGNOSIS — N2581 Secondary hyperparathyroidism of renal origin: Secondary | ICD-10-CM | POA: Diagnosis not present

## 2015-05-19 DIAGNOSIS — J90 Pleural effusion, not elsewhere classified: Secondary | ICD-10-CM | POA: Diagnosis not present

## 2015-05-19 DIAGNOSIS — R0602 Shortness of breath: Secondary | ICD-10-CM | POA: Diagnosis not present

## 2015-05-19 DIAGNOSIS — I132 Hypertensive heart and chronic kidney disease with heart failure and with stage 5 chronic kidney disease, or end stage renal disease: Secondary | ICD-10-CM | POA: Diagnosis not present

## 2015-05-19 DIAGNOSIS — I509 Heart failure, unspecified: Secondary | ICD-10-CM | POA: Diagnosis not present

## 2015-05-19 DIAGNOSIS — I120 Hypertensive chronic kidney disease with stage 5 chronic kidney disease or end stage renal disease: Secondary | ICD-10-CM | POA: Diagnosis not present

## 2015-05-19 DIAGNOSIS — R079 Chest pain, unspecified: Secondary | ICD-10-CM | POA: Diagnosis not present

## 2015-05-19 DIAGNOSIS — N186 End stage renal disease: Secondary | ICD-10-CM | POA: Diagnosis not present

## 2015-05-20 DIAGNOSIS — D631 Anemia in chronic kidney disease: Secondary | ICD-10-CM | POA: Diagnosis not present

## 2015-05-20 DIAGNOSIS — N186 End stage renal disease: Secondary | ICD-10-CM | POA: Diagnosis not present

## 2015-05-20 DIAGNOSIS — N2581 Secondary hyperparathyroidism of renal origin: Secondary | ICD-10-CM | POA: Diagnosis not present

## 2015-05-20 DIAGNOSIS — Z23 Encounter for immunization: Secondary | ICD-10-CM | POA: Diagnosis not present

## 2015-05-22 DIAGNOSIS — Z23 Encounter for immunization: Secondary | ICD-10-CM | POA: Diagnosis not present

## 2015-05-22 DIAGNOSIS — N2581 Secondary hyperparathyroidism of renal origin: Secondary | ICD-10-CM | POA: Diagnosis not present

## 2015-05-22 DIAGNOSIS — N186 End stage renal disease: Secondary | ICD-10-CM | POA: Diagnosis not present

## 2015-05-22 DIAGNOSIS — D631 Anemia in chronic kidney disease: Secondary | ICD-10-CM | POA: Diagnosis not present

## 2015-05-24 DIAGNOSIS — D631 Anemia in chronic kidney disease: Secondary | ICD-10-CM | POA: Diagnosis not present

## 2015-05-24 DIAGNOSIS — N186 End stage renal disease: Secondary | ICD-10-CM | POA: Diagnosis not present

## 2015-05-24 DIAGNOSIS — Z23 Encounter for immunization: Secondary | ICD-10-CM | POA: Diagnosis not present

## 2015-05-24 DIAGNOSIS — N2581 Secondary hyperparathyroidism of renal origin: Secondary | ICD-10-CM | POA: Diagnosis not present

## 2015-05-26 DIAGNOSIS — I120 Hypertensive chronic kidney disease with stage 5 chronic kidney disease or end stage renal disease: Secondary | ICD-10-CM | POA: Diagnosis not present

## 2015-05-26 DIAGNOSIS — Z992 Dependence on renal dialysis: Secondary | ICD-10-CM | POA: Diagnosis not present

## 2015-05-26 DIAGNOSIS — N186 End stage renal disease: Secondary | ICD-10-CM | POA: Diagnosis not present

## 2015-05-27 DIAGNOSIS — N2581 Secondary hyperparathyroidism of renal origin: Secondary | ICD-10-CM | POA: Diagnosis not present

## 2015-05-27 DIAGNOSIS — Z23 Encounter for immunization: Secondary | ICD-10-CM | POA: Diagnosis not present

## 2015-05-27 DIAGNOSIS — D631 Anemia in chronic kidney disease: Secondary | ICD-10-CM | POA: Diagnosis not present

## 2015-05-27 DIAGNOSIS — N186 End stage renal disease: Secondary | ICD-10-CM | POA: Diagnosis not present

## 2015-06-04 DIAGNOSIS — N189 Chronic kidney disease, unspecified: Secondary | ICD-10-CM | POA: Diagnosis not present

## 2015-06-04 DIAGNOSIS — Z6825 Body mass index (BMI) 25.0-25.9, adult: Secondary | ICD-10-CM | POA: Diagnosis not present

## 2015-06-04 DIAGNOSIS — I10 Essential (primary) hypertension: Secondary | ICD-10-CM | POA: Diagnosis not present

## 2015-06-04 DIAGNOSIS — M545 Low back pain: Secondary | ICD-10-CM | POA: Diagnosis not present

## 2015-06-25 DIAGNOSIS — Z992 Dependence on renal dialysis: Secondary | ICD-10-CM | POA: Diagnosis not present

## 2015-06-25 DIAGNOSIS — I120 Hypertensive chronic kidney disease with stage 5 chronic kidney disease or end stage renal disease: Secondary | ICD-10-CM | POA: Diagnosis not present

## 2015-06-25 DIAGNOSIS — N186 End stage renal disease: Secondary | ICD-10-CM | POA: Diagnosis not present

## 2015-06-26 DIAGNOSIS — N186 End stage renal disease: Secondary | ICD-10-CM | POA: Diagnosis not present

## 2015-06-26 DIAGNOSIS — D631 Anemia in chronic kidney disease: Secondary | ICD-10-CM | POA: Diagnosis not present

## 2015-06-26 DIAGNOSIS — N2581 Secondary hyperparathyroidism of renal origin: Secondary | ICD-10-CM | POA: Diagnosis not present

## 2015-06-26 DIAGNOSIS — D509 Iron deficiency anemia, unspecified: Secondary | ICD-10-CM | POA: Diagnosis not present

## 2015-06-28 DIAGNOSIS — D631 Anemia in chronic kidney disease: Secondary | ICD-10-CM | POA: Diagnosis not present

## 2015-06-28 DIAGNOSIS — D509 Iron deficiency anemia, unspecified: Secondary | ICD-10-CM | POA: Diagnosis not present

## 2015-06-28 DIAGNOSIS — N186 End stage renal disease: Secondary | ICD-10-CM | POA: Diagnosis not present

## 2015-06-28 DIAGNOSIS — N2581 Secondary hyperparathyroidism of renal origin: Secondary | ICD-10-CM | POA: Diagnosis not present

## 2015-07-01 DIAGNOSIS — D631 Anemia in chronic kidney disease: Secondary | ICD-10-CM | POA: Diagnosis not present

## 2015-07-01 DIAGNOSIS — D509 Iron deficiency anemia, unspecified: Secondary | ICD-10-CM | POA: Diagnosis not present

## 2015-07-01 DIAGNOSIS — N2581 Secondary hyperparathyroidism of renal origin: Secondary | ICD-10-CM | POA: Diagnosis not present

## 2015-07-01 DIAGNOSIS — N186 End stage renal disease: Secondary | ICD-10-CM | POA: Diagnosis not present

## 2015-07-03 DIAGNOSIS — D509 Iron deficiency anemia, unspecified: Secondary | ICD-10-CM | POA: Diagnosis not present

## 2015-07-03 DIAGNOSIS — N2581 Secondary hyperparathyroidism of renal origin: Secondary | ICD-10-CM | POA: Diagnosis not present

## 2015-07-03 DIAGNOSIS — N186 End stage renal disease: Secondary | ICD-10-CM | POA: Diagnosis not present

## 2015-07-03 DIAGNOSIS — D631 Anemia in chronic kidney disease: Secondary | ICD-10-CM | POA: Diagnosis not present

## 2015-07-05 DIAGNOSIS — D509 Iron deficiency anemia, unspecified: Secondary | ICD-10-CM | POA: Diagnosis not present

## 2015-07-05 DIAGNOSIS — N186 End stage renal disease: Secondary | ICD-10-CM | POA: Diagnosis not present

## 2015-07-05 DIAGNOSIS — D631 Anemia in chronic kidney disease: Secondary | ICD-10-CM | POA: Diagnosis not present

## 2015-07-05 DIAGNOSIS — N2581 Secondary hyperparathyroidism of renal origin: Secondary | ICD-10-CM | POA: Diagnosis not present

## 2015-07-08 DIAGNOSIS — D509 Iron deficiency anemia, unspecified: Secondary | ICD-10-CM | POA: Diagnosis not present

## 2015-07-08 DIAGNOSIS — N2581 Secondary hyperparathyroidism of renal origin: Secondary | ICD-10-CM | POA: Diagnosis not present

## 2015-07-08 DIAGNOSIS — D631 Anemia in chronic kidney disease: Secondary | ICD-10-CM | POA: Diagnosis not present

## 2015-07-08 DIAGNOSIS — N186 End stage renal disease: Secondary | ICD-10-CM | POA: Diagnosis not present

## 2015-07-10 DIAGNOSIS — D631 Anemia in chronic kidney disease: Secondary | ICD-10-CM | POA: Diagnosis not present

## 2015-07-10 DIAGNOSIS — N186 End stage renal disease: Secondary | ICD-10-CM | POA: Diagnosis not present

## 2015-07-10 DIAGNOSIS — D509 Iron deficiency anemia, unspecified: Secondary | ICD-10-CM | POA: Diagnosis not present

## 2015-07-10 DIAGNOSIS — N2581 Secondary hyperparathyroidism of renal origin: Secondary | ICD-10-CM | POA: Diagnosis not present

## 2015-07-11 DIAGNOSIS — I10 Essential (primary) hypertension: Secondary | ICD-10-CM | POA: Diagnosis not present

## 2015-07-11 DIAGNOSIS — M545 Low back pain: Secondary | ICD-10-CM | POA: Diagnosis not present

## 2015-07-11 DIAGNOSIS — N185 Chronic kidney disease, stage 5: Secondary | ICD-10-CM | POA: Diagnosis not present

## 2015-07-11 DIAGNOSIS — Z6825 Body mass index (BMI) 25.0-25.9, adult: Secondary | ICD-10-CM | POA: Diagnosis not present

## 2015-07-12 DIAGNOSIS — D631 Anemia in chronic kidney disease: Secondary | ICD-10-CM | POA: Diagnosis not present

## 2015-07-12 DIAGNOSIS — N186 End stage renal disease: Secondary | ICD-10-CM | POA: Diagnosis not present

## 2015-07-12 DIAGNOSIS — N2581 Secondary hyperparathyroidism of renal origin: Secondary | ICD-10-CM | POA: Diagnosis not present

## 2015-07-12 DIAGNOSIS — D509 Iron deficiency anemia, unspecified: Secondary | ICD-10-CM | POA: Diagnosis not present

## 2015-07-15 DIAGNOSIS — N2581 Secondary hyperparathyroidism of renal origin: Secondary | ICD-10-CM | POA: Diagnosis not present

## 2015-07-15 DIAGNOSIS — D631 Anemia in chronic kidney disease: Secondary | ICD-10-CM | POA: Diagnosis not present

## 2015-07-15 DIAGNOSIS — D509 Iron deficiency anemia, unspecified: Secondary | ICD-10-CM | POA: Diagnosis not present

## 2015-07-15 DIAGNOSIS — N186 End stage renal disease: Secondary | ICD-10-CM | POA: Diagnosis not present

## 2015-07-16 DIAGNOSIS — I517 Cardiomegaly: Secondary | ICD-10-CM | POA: Diagnosis not present

## 2015-07-16 DIAGNOSIS — Z01810 Encounter for preprocedural cardiovascular examination: Secondary | ICD-10-CM | POA: Diagnosis not present

## 2015-07-16 DIAGNOSIS — I350 Nonrheumatic aortic (valve) stenosis: Secondary | ICD-10-CM | POA: Diagnosis not present

## 2015-07-17 DIAGNOSIS — N2581 Secondary hyperparathyroidism of renal origin: Secondary | ICD-10-CM | POA: Diagnosis not present

## 2015-07-17 DIAGNOSIS — N186 End stage renal disease: Secondary | ICD-10-CM | POA: Diagnosis not present

## 2015-07-17 DIAGNOSIS — D509 Iron deficiency anemia, unspecified: Secondary | ICD-10-CM | POA: Diagnosis not present

## 2015-07-17 DIAGNOSIS — D631 Anemia in chronic kidney disease: Secondary | ICD-10-CM | POA: Diagnosis not present

## 2015-07-19 DIAGNOSIS — N186 End stage renal disease: Secondary | ICD-10-CM | POA: Diagnosis not present

## 2015-07-19 DIAGNOSIS — D631 Anemia in chronic kidney disease: Secondary | ICD-10-CM | POA: Diagnosis not present

## 2015-07-19 DIAGNOSIS — N2581 Secondary hyperparathyroidism of renal origin: Secondary | ICD-10-CM | POA: Diagnosis not present

## 2015-07-19 DIAGNOSIS — D509 Iron deficiency anemia, unspecified: Secondary | ICD-10-CM | POA: Diagnosis not present

## 2015-07-22 DIAGNOSIS — D509 Iron deficiency anemia, unspecified: Secondary | ICD-10-CM | POA: Diagnosis not present

## 2015-07-22 DIAGNOSIS — N186 End stage renal disease: Secondary | ICD-10-CM | POA: Diagnosis not present

## 2015-07-22 DIAGNOSIS — N2581 Secondary hyperparathyroidism of renal origin: Secondary | ICD-10-CM | POA: Diagnosis not present

## 2015-07-22 DIAGNOSIS — D631 Anemia in chronic kidney disease: Secondary | ICD-10-CM | POA: Diagnosis not present

## 2015-07-23 DIAGNOSIS — Z992 Dependence on renal dialysis: Secondary | ICD-10-CM | POA: Diagnosis not present

## 2015-07-23 DIAGNOSIS — I871 Compression of vein: Secondary | ICD-10-CM | POA: Diagnosis not present

## 2015-07-23 DIAGNOSIS — N186 End stage renal disease: Secondary | ICD-10-CM | POA: Diagnosis not present

## 2015-07-23 DIAGNOSIS — T82858A Stenosis of vascular prosthetic devices, implants and grafts, initial encounter: Secondary | ICD-10-CM | POA: Diagnosis not present

## 2015-07-24 DIAGNOSIS — D509 Iron deficiency anemia, unspecified: Secondary | ICD-10-CM | POA: Diagnosis not present

## 2015-07-24 DIAGNOSIS — D631 Anemia in chronic kidney disease: Secondary | ICD-10-CM | POA: Diagnosis not present

## 2015-07-24 DIAGNOSIS — N186 End stage renal disease: Secondary | ICD-10-CM | POA: Diagnosis not present

## 2015-07-24 DIAGNOSIS — N2581 Secondary hyperparathyroidism of renal origin: Secondary | ICD-10-CM | POA: Diagnosis not present

## 2015-07-26 DIAGNOSIS — D631 Anemia in chronic kidney disease: Secondary | ICD-10-CM | POA: Diagnosis not present

## 2015-07-26 DIAGNOSIS — D509 Iron deficiency anemia, unspecified: Secondary | ICD-10-CM | POA: Diagnosis not present

## 2015-07-26 DIAGNOSIS — N186 End stage renal disease: Secondary | ICD-10-CM | POA: Diagnosis not present

## 2015-07-26 DIAGNOSIS — I120 Hypertensive chronic kidney disease with stage 5 chronic kidney disease or end stage renal disease: Secondary | ICD-10-CM | POA: Diagnosis not present

## 2015-07-26 DIAGNOSIS — Z992 Dependence on renal dialysis: Secondary | ICD-10-CM | POA: Diagnosis not present

## 2015-07-26 DIAGNOSIS — N2581 Secondary hyperparathyroidism of renal origin: Secondary | ICD-10-CM | POA: Diagnosis not present

## 2015-07-29 DIAGNOSIS — N186 End stage renal disease: Secondary | ICD-10-CM | POA: Diagnosis not present

## 2015-07-29 DIAGNOSIS — N2581 Secondary hyperparathyroidism of renal origin: Secondary | ICD-10-CM | POA: Diagnosis not present

## 2015-07-29 DIAGNOSIS — D631 Anemia in chronic kidney disease: Secondary | ICD-10-CM | POA: Diagnosis not present

## 2015-07-29 DIAGNOSIS — D509 Iron deficiency anemia, unspecified: Secondary | ICD-10-CM | POA: Diagnosis not present

## 2015-08-08 DIAGNOSIS — I1311 Hypertensive heart and chronic kidney disease without heart failure, with stage 5 chronic kidney disease, or end stage renal disease: Secondary | ICD-10-CM | POA: Diagnosis not present

## 2015-08-08 DIAGNOSIS — I350 Nonrheumatic aortic (valve) stenosis: Secondary | ICD-10-CM | POA: Diagnosis not present

## 2015-08-08 DIAGNOSIS — N186 End stage renal disease: Secondary | ICD-10-CM | POA: Diagnosis not present

## 2015-08-08 DIAGNOSIS — I499 Cardiac arrhythmia, unspecified: Secondary | ICD-10-CM | POA: Diagnosis not present

## 2015-08-08 DIAGNOSIS — I517 Cardiomegaly: Secondary | ICD-10-CM | POA: Diagnosis not present

## 2015-08-08 DIAGNOSIS — I4581 Long QT syndrome: Secondary | ICD-10-CM | POA: Diagnosis not present

## 2015-08-08 DIAGNOSIS — I2510 Atherosclerotic heart disease of native coronary artery without angina pectoris: Secondary | ICD-10-CM | POA: Diagnosis not present

## 2015-08-08 DIAGNOSIS — Z7902 Long term (current) use of antithrombotics/antiplatelets: Secondary | ICD-10-CM | POA: Diagnosis not present

## 2015-08-11 DIAGNOSIS — M545 Low back pain: Secondary | ICD-10-CM | POA: Diagnosis not present

## 2015-08-11 DIAGNOSIS — N185 Chronic kidney disease, stage 5: Secondary | ICD-10-CM | POA: Diagnosis not present

## 2015-08-11 DIAGNOSIS — I10 Essential (primary) hypertension: Secondary | ICD-10-CM | POA: Diagnosis not present

## 2015-08-11 DIAGNOSIS — Z8669 Personal history of other diseases of the nervous system and sense organs: Secondary | ICD-10-CM | POA: Diagnosis not present

## 2015-08-11 DIAGNOSIS — Z6825 Body mass index (BMI) 25.0-25.9, adult: Secondary | ICD-10-CM | POA: Diagnosis not present

## 2015-08-26 DIAGNOSIS — N189 Chronic kidney disease, unspecified: Secondary | ICD-10-CM | POA: Diagnosis not present

## 2015-08-26 DIAGNOSIS — J9602 Acute respiratory failure with hypercapnia: Secondary | ICD-10-CM | POA: Diagnosis not present

## 2015-08-26 DIAGNOSIS — N19 Unspecified kidney failure: Secondary | ICD-10-CM | POA: Diagnosis not present

## 2015-08-26 DIAGNOSIS — J810 Acute pulmonary edema: Secondary | ICD-10-CM | POA: Diagnosis not present

## 2015-08-26 DIAGNOSIS — R0902 Hypoxemia: Secondary | ICD-10-CM | POA: Diagnosis not present

## 2015-08-26 DIAGNOSIS — I120 Hypertensive chronic kidney disease with stage 5 chronic kidney disease or end stage renal disease: Secondary | ICD-10-CM | POA: Diagnosis not present

## 2015-08-26 DIAGNOSIS — Z992 Dependence on renal dialysis: Secondary | ICD-10-CM | POA: Diagnosis not present

## 2015-08-26 DIAGNOSIS — R062 Wheezing: Secondary | ICD-10-CM | POA: Diagnosis not present

## 2015-08-26 DIAGNOSIS — R069 Unspecified abnormalities of breathing: Secondary | ICD-10-CM | POA: Diagnosis not present

## 2015-08-26 DIAGNOSIS — N186 End stage renal disease: Secondary | ICD-10-CM | POA: Diagnosis not present

## 2015-08-26 DIAGNOSIS — R404 Transient alteration of awareness: Secondary | ICD-10-CM | POA: Diagnosis not present

## 2015-08-26 DIAGNOSIS — R0602 Shortness of breath: Secondary | ICD-10-CM | POA: Diagnosis not present

## 2015-08-28 DIAGNOSIS — N186 End stage renal disease: Secondary | ICD-10-CM | POA: Diagnosis not present

## 2015-08-28 DIAGNOSIS — N2581 Secondary hyperparathyroidism of renal origin: Secondary | ICD-10-CM | POA: Diagnosis not present

## 2015-08-28 DIAGNOSIS — D631 Anemia in chronic kidney disease: Secondary | ICD-10-CM | POA: Diagnosis not present

## 2015-09-02 DIAGNOSIS — H2513 Age-related nuclear cataract, bilateral: Secondary | ICD-10-CM | POA: Diagnosis not present

## 2015-09-02 DIAGNOSIS — H353231 Exudative age-related macular degeneration, bilateral, with active choroidal neovascularization: Secondary | ICD-10-CM | POA: Diagnosis not present

## 2015-09-02 DIAGNOSIS — H35033 Hypertensive retinopathy, bilateral: Secondary | ICD-10-CM | POA: Diagnosis not present

## 2015-09-23 DIAGNOSIS — N186 End stage renal disease: Secondary | ICD-10-CM | POA: Diagnosis not present

## 2015-09-23 DIAGNOSIS — Z992 Dependence on renal dialysis: Secondary | ICD-10-CM | POA: Diagnosis not present

## 2015-09-23 DIAGNOSIS — I120 Hypertensive chronic kidney disease with stage 5 chronic kidney disease or end stage renal disease: Secondary | ICD-10-CM | POA: Diagnosis not present

## 2015-09-24 DIAGNOSIS — N186 End stage renal disease: Secondary | ICD-10-CM | POA: Diagnosis not present

## 2015-09-24 DIAGNOSIS — I120 Hypertensive chronic kidney disease with stage 5 chronic kidney disease or end stage renal disease: Secondary | ICD-10-CM | POA: Diagnosis not present

## 2015-09-24 DIAGNOSIS — Z992 Dependence on renal dialysis: Secondary | ICD-10-CM | POA: Diagnosis not present

## 2015-09-25 DIAGNOSIS — N186 End stage renal disease: Secondary | ICD-10-CM | POA: Diagnosis not present

## 2015-09-25 DIAGNOSIS — N2581 Secondary hyperparathyroidism of renal origin: Secondary | ICD-10-CM | POA: Diagnosis not present

## 2015-09-25 DIAGNOSIS — D509 Iron deficiency anemia, unspecified: Secondary | ICD-10-CM | POA: Diagnosis not present

## 2015-09-26 DIAGNOSIS — F17200 Nicotine dependence, unspecified, uncomplicated: Secondary | ICD-10-CM | POA: Diagnosis not present

## 2015-09-26 DIAGNOSIS — R6882 Decreased libido: Secondary | ICD-10-CM | POA: Diagnosis not present

## 2015-09-26 DIAGNOSIS — N529 Male erectile dysfunction, unspecified: Secondary | ICD-10-CM | POA: Diagnosis not present

## 2015-09-26 DIAGNOSIS — N186 End stage renal disease: Secondary | ICD-10-CM | POA: Diagnosis not present

## 2015-09-27 DIAGNOSIS — D509 Iron deficiency anemia, unspecified: Secondary | ICD-10-CM | POA: Diagnosis not present

## 2015-09-27 DIAGNOSIS — N186 End stage renal disease: Secondary | ICD-10-CM | POA: Diagnosis not present

## 2015-09-27 DIAGNOSIS — N2581 Secondary hyperparathyroidism of renal origin: Secondary | ICD-10-CM | POA: Diagnosis not present

## 2015-09-28 DIAGNOSIS — I2510 Atherosclerotic heart disease of native coronary artery without angina pectoris: Secondary | ICD-10-CM | POA: Diagnosis present

## 2015-09-28 DIAGNOSIS — R079 Chest pain, unspecified: Secondary | ICD-10-CM | POA: Diagnosis not present

## 2015-09-28 DIAGNOSIS — Z5181 Encounter for therapeutic drug level monitoring: Secondary | ICD-10-CM | POA: Diagnosis not present

## 2015-09-28 DIAGNOSIS — I252 Old myocardial infarction: Secondary | ICD-10-CM | POA: Diagnosis not present

## 2015-09-28 DIAGNOSIS — I10 Essential (primary) hypertension: Secondary | ICD-10-CM | POA: Diagnosis not present

## 2015-09-28 DIAGNOSIS — Z955 Presence of coronary angioplasty implant and graft: Secondary | ICD-10-CM | POA: Diagnosis not present

## 2015-09-28 DIAGNOSIS — J9811 Atelectasis: Secondary | ICD-10-CM | POA: Diagnosis present

## 2015-09-28 DIAGNOSIS — I120 Hypertensive chronic kidney disease with stage 5 chronic kidney disease or end stage renal disease: Secondary | ICD-10-CM | POA: Diagnosis not present

## 2015-09-28 DIAGNOSIS — I350 Nonrheumatic aortic (valve) stenosis: Secondary | ICD-10-CM | POA: Diagnosis present

## 2015-09-28 DIAGNOSIS — Z7982 Long term (current) use of aspirin: Secondary | ICD-10-CM | POA: Diagnosis not present

## 2015-09-28 DIAGNOSIS — Z7902 Long term (current) use of antithrombotics/antiplatelets: Secondary | ICD-10-CM | POA: Diagnosis not present

## 2015-09-28 DIAGNOSIS — Z01818 Encounter for other preprocedural examination: Secondary | ICD-10-CM | POA: Diagnosis not present

## 2015-09-28 DIAGNOSIS — Z940 Kidney transplant status: Secondary | ICD-10-CM | POA: Diagnosis not present

## 2015-09-28 DIAGNOSIS — Z8673 Personal history of transient ischemic attack (TIA), and cerebral infarction without residual deficits: Secondary | ICD-10-CM | POA: Diagnosis not present

## 2015-09-28 DIAGNOSIS — N186 End stage renal disease: Secondary | ICD-10-CM | POA: Diagnosis present

## 2015-09-28 DIAGNOSIS — E8770 Fluid overload, unspecified: Secondary | ICD-10-CM | POA: Diagnosis not present

## 2015-09-28 DIAGNOSIS — Z9889 Other specified postprocedural states: Secondary | ICD-10-CM | POA: Diagnosis not present

## 2015-09-28 DIAGNOSIS — Z87891 Personal history of nicotine dependence: Secondary | ICD-10-CM | POA: Diagnosis not present

## 2015-09-28 DIAGNOSIS — H269 Unspecified cataract: Secondary | ICD-10-CM | POA: Diagnosis present

## 2015-09-28 DIAGNOSIS — D631 Anemia in chronic kidney disease: Secondary | ICD-10-CM | POA: Diagnosis not present

## 2015-09-28 DIAGNOSIS — I517 Cardiomegaly: Secondary | ICD-10-CM | POA: Diagnosis not present

## 2015-09-28 DIAGNOSIS — Z79899 Other long term (current) drug therapy: Secondary | ICD-10-CM | POA: Diagnosis not present

## 2015-09-28 DIAGNOSIS — Z4822 Encounter for aftercare following kidney transplant: Secondary | ICD-10-CM | POA: Diagnosis not present

## 2015-09-28 DIAGNOSIS — H353231 Exudative age-related macular degeneration, bilateral, with active choroidal neovascularization: Secondary | ICD-10-CM | POA: Diagnosis present

## 2015-09-28 DIAGNOSIS — Z992 Dependence on renal dialysis: Secondary | ICD-10-CM | POA: Diagnosis not present

## 2015-09-28 DIAGNOSIS — Z452 Encounter for adjustment and management of vascular access device: Secondary | ICD-10-CM | POA: Diagnosis not present

## 2015-10-06 DIAGNOSIS — Z992 Dependence on renal dialysis: Secondary | ICD-10-CM | POA: Diagnosis not present

## 2015-10-06 DIAGNOSIS — D631 Anemia in chronic kidney disease: Secondary | ICD-10-CM | POA: Diagnosis not present

## 2015-10-06 DIAGNOSIS — Z4822 Encounter for aftercare following kidney transplant: Secondary | ICD-10-CM | POA: Diagnosis not present

## 2015-10-06 DIAGNOSIS — I770 Arteriovenous fistula, acquired: Secondary | ICD-10-CM | POA: Diagnosis not present

## 2015-10-06 DIAGNOSIS — Z5181 Encounter for therapeutic drug level monitoring: Secondary | ICD-10-CM | POA: Diagnosis not present

## 2015-10-06 DIAGNOSIS — N186 End stage renal disease: Secondary | ICD-10-CM | POA: Diagnosis not present

## 2015-10-06 DIAGNOSIS — Z940 Kidney transplant status: Secondary | ICD-10-CM | POA: Diagnosis not present

## 2015-10-06 DIAGNOSIS — I120 Hypertensive chronic kidney disease with stage 5 chronic kidney disease or end stage renal disease: Secondary | ICD-10-CM | POA: Diagnosis not present

## 2015-10-06 DIAGNOSIS — Z79899 Other long term (current) drug therapy: Secondary | ICD-10-CM | POA: Diagnosis not present

## 2015-10-06 DIAGNOSIS — T8619 Other complication of kidney transplant: Secondary | ICD-10-CM | POA: Diagnosis not present

## 2015-10-07 DIAGNOSIS — T8619 Other complication of kidney transplant: Secondary | ICD-10-CM | POA: Diagnosis not present

## 2015-10-07 DIAGNOSIS — Z992 Dependence on renal dialysis: Secondary | ICD-10-CM | POA: Diagnosis not present

## 2015-10-07 DIAGNOSIS — I722 Aneurysm of renal artery: Secondary | ICD-10-CM | POA: Diagnosis not present

## 2015-10-07 DIAGNOSIS — Z940 Kidney transplant status: Secondary | ICD-10-CM | POA: Diagnosis not present

## 2015-10-07 DIAGNOSIS — I2510 Atherosclerotic heart disease of native coronary artery without angina pectoris: Secondary | ICD-10-CM | POA: Diagnosis present

## 2015-10-07 DIAGNOSIS — E785 Hyperlipidemia, unspecified: Secondary | ICD-10-CM | POA: Diagnosis present

## 2015-10-07 DIAGNOSIS — L930 Discoid lupus erythematosus: Secondary | ICD-10-CM | POA: Diagnosis present

## 2015-10-07 DIAGNOSIS — D631 Anemia in chronic kidney disease: Secondary | ICD-10-CM | POA: Diagnosis not present

## 2015-10-07 DIAGNOSIS — Z5181 Encounter for therapeutic drug level monitoring: Secondary | ICD-10-CM | POA: Diagnosis not present

## 2015-10-07 DIAGNOSIS — Z79899 Other long term (current) drug therapy: Secondary | ICD-10-CM | POA: Diagnosis not present

## 2015-10-07 DIAGNOSIS — Z87891 Personal history of nicotine dependence: Secondary | ICD-10-CM | POA: Diagnosis not present

## 2015-10-07 DIAGNOSIS — Z4822 Encounter for aftercare following kidney transplant: Secondary | ICD-10-CM | POA: Diagnosis not present

## 2015-10-07 DIAGNOSIS — N186 End stage renal disease: Secondary | ICD-10-CM | POA: Diagnosis not present

## 2015-10-07 DIAGNOSIS — I770 Arteriovenous fistula, acquired: Secondary | ICD-10-CM | POA: Diagnosis not present

## 2015-10-07 DIAGNOSIS — I120 Hypertensive chronic kidney disease with stage 5 chronic kidney disease or end stage renal disease: Secondary | ICD-10-CM | POA: Diagnosis not present

## 2015-10-07 DIAGNOSIS — Z7982 Long term (current) use of aspirin: Secondary | ICD-10-CM | POA: Diagnosis not present

## 2015-10-10 DIAGNOSIS — N186 End stage renal disease: Secondary | ICD-10-CM | POA: Diagnosis not present

## 2015-10-10 DIAGNOSIS — I120 Hypertensive chronic kidney disease with stage 5 chronic kidney disease or end stage renal disease: Secondary | ICD-10-CM | POA: Diagnosis not present

## 2015-10-10 DIAGNOSIS — E785 Hyperlipidemia, unspecified: Secondary | ICD-10-CM | POA: Diagnosis not present

## 2015-10-10 DIAGNOSIS — D631 Anemia in chronic kidney disease: Secondary | ICD-10-CM | POA: Diagnosis not present

## 2015-10-10 DIAGNOSIS — Z87891 Personal history of nicotine dependence: Secondary | ICD-10-CM | POA: Diagnosis not present

## 2015-10-10 DIAGNOSIS — Z79899 Other long term (current) drug therapy: Secondary | ICD-10-CM | POA: Diagnosis not present

## 2015-10-10 DIAGNOSIS — Z4822 Encounter for aftercare following kidney transplant: Secondary | ICD-10-CM | POA: Diagnosis not present

## 2015-10-10 DIAGNOSIS — Z7982 Long term (current) use of aspirin: Secondary | ICD-10-CM | POA: Diagnosis not present

## 2015-10-10 DIAGNOSIS — I2510 Atherosclerotic heart disease of native coronary artery without angina pectoris: Secondary | ICD-10-CM | POA: Diagnosis not present

## 2015-10-10 DIAGNOSIS — Z940 Kidney transplant status: Secondary | ICD-10-CM | POA: Diagnosis not present

## 2015-10-10 DIAGNOSIS — D899 Disorder involving the immune mechanism, unspecified: Secondary | ICD-10-CM | POA: Diagnosis not present

## 2015-10-10 DIAGNOSIS — Z8719 Personal history of other diseases of the digestive system: Secondary | ICD-10-CM | POA: Diagnosis not present

## 2015-10-10 DIAGNOSIS — Z961 Presence of intraocular lens: Secondary | ICD-10-CM | POA: Diagnosis not present

## 2015-10-10 DIAGNOSIS — L930 Discoid lupus erythematosus: Secondary | ICD-10-CM | POA: Diagnosis not present

## 2015-10-10 DIAGNOSIS — Z992 Dependence on renal dialysis: Secondary | ICD-10-CM | POA: Diagnosis not present

## 2015-10-13 DIAGNOSIS — Z992 Dependence on renal dialysis: Secondary | ICD-10-CM | POA: Diagnosis not present

## 2015-10-13 DIAGNOSIS — D631 Anemia in chronic kidney disease: Secondary | ICD-10-CM | POA: Diagnosis not present

## 2015-10-13 DIAGNOSIS — D899 Disorder involving the immune mechanism, unspecified: Secondary | ICD-10-CM | POA: Diagnosis not present

## 2015-10-13 DIAGNOSIS — E785 Hyperlipidemia, unspecified: Secondary | ICD-10-CM | POA: Diagnosis not present

## 2015-10-13 DIAGNOSIS — D649 Anemia, unspecified: Secondary | ICD-10-CM | POA: Diagnosis not present

## 2015-10-13 DIAGNOSIS — I2510 Atherosclerotic heart disease of native coronary artery without angina pectoris: Secondary | ICD-10-CM | POA: Diagnosis not present

## 2015-10-13 DIAGNOSIS — Z7982 Long term (current) use of aspirin: Secondary | ICD-10-CM | POA: Diagnosis not present

## 2015-10-13 DIAGNOSIS — Z79899 Other long term (current) drug therapy: Secondary | ICD-10-CM | POA: Diagnosis not present

## 2015-10-13 DIAGNOSIS — I10 Essential (primary) hypertension: Secondary | ICD-10-CM | POA: Diagnosis not present

## 2015-10-13 DIAGNOSIS — I120 Hypertensive chronic kidney disease with stage 5 chronic kidney disease or end stage renal disease: Secondary | ICD-10-CM | POA: Diagnosis not present

## 2015-10-13 DIAGNOSIS — I770 Arteriovenous fistula, acquired: Secondary | ICD-10-CM | POA: Diagnosis not present

## 2015-10-13 DIAGNOSIS — T8619 Other complication of kidney transplant: Secondary | ICD-10-CM | POA: Diagnosis not present

## 2015-10-13 DIAGNOSIS — Z7952 Long term (current) use of systemic steroids: Secondary | ICD-10-CM | POA: Diagnosis not present

## 2015-10-13 DIAGNOSIS — N186 End stage renal disease: Secondary | ICD-10-CM | POA: Diagnosis not present

## 2015-10-16 DIAGNOSIS — Z7982 Long term (current) use of aspirin: Secondary | ICD-10-CM | POA: Diagnosis not present

## 2015-10-16 DIAGNOSIS — I2510 Atherosclerotic heart disease of native coronary artery without angina pectoris: Secondary | ICD-10-CM | POA: Diagnosis not present

## 2015-10-16 DIAGNOSIS — Z940 Kidney transplant status: Secondary | ICD-10-CM | POA: Diagnosis not present

## 2015-10-16 DIAGNOSIS — Z4822 Encounter for aftercare following kidney transplant: Secondary | ICD-10-CM | POA: Diagnosis not present

## 2015-10-16 DIAGNOSIS — N186 End stage renal disease: Secondary | ICD-10-CM | POA: Diagnosis not present

## 2015-10-16 DIAGNOSIS — D899 Disorder involving the immune mechanism, unspecified: Secondary | ICD-10-CM | POA: Diagnosis not present

## 2015-10-16 DIAGNOSIS — I10 Essential (primary) hypertension: Secondary | ICD-10-CM | POA: Diagnosis not present

## 2015-10-16 DIAGNOSIS — E785 Hyperlipidemia, unspecified: Secondary | ICD-10-CM | POA: Diagnosis not present

## 2015-10-16 DIAGNOSIS — I120 Hypertensive chronic kidney disease with stage 5 chronic kidney disease or end stage renal disease: Secondary | ICD-10-CM | POA: Diagnosis not present

## 2015-10-16 DIAGNOSIS — D8989 Other specified disorders involving the immune mechanism, not elsewhere classified: Secondary | ICD-10-CM | POA: Diagnosis not present

## 2015-10-16 DIAGNOSIS — D631 Anemia in chronic kidney disease: Secondary | ICD-10-CM | POA: Diagnosis not present

## 2015-10-16 DIAGNOSIS — Z79899 Other long term (current) drug therapy: Secondary | ICD-10-CM | POA: Diagnosis not present

## 2015-10-18 DIAGNOSIS — I252 Old myocardial infarction: Secondary | ICD-10-CM | POA: Diagnosis not present

## 2015-10-18 DIAGNOSIS — R0602 Shortness of breath: Secondary | ICD-10-CM | POA: Diagnosis not present

## 2015-10-18 DIAGNOSIS — J811 Chronic pulmonary edema: Secondary | ICD-10-CM | POA: Diagnosis not present

## 2015-10-18 DIAGNOSIS — I110 Hypertensive heart disease with heart failure: Secondary | ICD-10-CM | POA: Diagnosis not present

## 2015-10-18 DIAGNOSIS — R0609 Other forms of dyspnea: Secondary | ICD-10-CM | POA: Diagnosis not present

## 2015-10-18 DIAGNOSIS — Z940 Kidney transplant status: Secondary | ICD-10-CM | POA: Diagnosis not present

## 2015-10-18 DIAGNOSIS — Z7982 Long term (current) use of aspirin: Secondary | ICD-10-CM | POA: Diagnosis not present

## 2015-10-18 DIAGNOSIS — I25119 Atherosclerotic heart disease of native coronary artery with unspecified angina pectoris: Secondary | ICD-10-CM | POA: Diagnosis not present

## 2015-10-18 DIAGNOSIS — F329 Major depressive disorder, single episode, unspecified: Secondary | ICD-10-CM | POA: Diagnosis not present

## 2015-10-18 DIAGNOSIS — I5022 Chronic systolic (congestive) heart failure: Secondary | ICD-10-CM | POA: Diagnosis not present

## 2015-10-18 DIAGNOSIS — R001 Bradycardia, unspecified: Secondary | ICD-10-CM | POA: Diagnosis not present

## 2015-10-18 DIAGNOSIS — E875 Hyperkalemia: Secondary | ICD-10-CM | POA: Diagnosis not present

## 2015-10-18 DIAGNOSIS — I441 Atrioventricular block, second degree: Secondary | ICD-10-CM | POA: Diagnosis not present

## 2015-10-18 DIAGNOSIS — F17210 Nicotine dependence, cigarettes, uncomplicated: Secondary | ICD-10-CM | POA: Diagnosis present

## 2015-10-18 DIAGNOSIS — Z79899 Other long term (current) drug therapy: Secondary | ICD-10-CM | POA: Diagnosis not present

## 2015-10-18 DIAGNOSIS — I509 Heart failure, unspecified: Secondary | ICD-10-CM | POA: Diagnosis not present

## 2015-10-18 DIAGNOSIS — I129 Hypertensive chronic kidney disease with stage 1 through stage 4 chronic kidney disease, or unspecified chronic kidney disease: Secondary | ICD-10-CM | POA: Diagnosis not present

## 2015-10-18 DIAGNOSIS — N189 Chronic kidney disease, unspecified: Secondary | ICD-10-CM | POA: Diagnosis present

## 2015-10-18 DIAGNOSIS — J90 Pleural effusion, not elsewhere classified: Secondary | ICD-10-CM | POA: Diagnosis not present

## 2015-10-21 DIAGNOSIS — Z940 Kidney transplant status: Secondary | ICD-10-CM | POA: Diagnosis not present

## 2015-10-21 DIAGNOSIS — R1013 Epigastric pain: Secondary | ICD-10-CM | POA: Diagnosis not present

## 2015-10-21 DIAGNOSIS — K5730 Diverticulosis of large intestine without perforation or abscess without bleeding: Secondary | ICD-10-CM | POA: Diagnosis not present

## 2015-10-21 DIAGNOSIS — R1033 Periumbilical pain: Secondary | ICD-10-CM | POA: Diagnosis not present

## 2015-10-21 DIAGNOSIS — R1084 Generalized abdominal pain: Secondary | ICD-10-CM | POA: Diagnosis not present

## 2015-10-23 DIAGNOSIS — Z992 Dependence on renal dialysis: Secondary | ICD-10-CM | POA: Diagnosis not present

## 2015-10-23 DIAGNOSIS — Z940 Kidney transplant status: Secondary | ICD-10-CM | POA: Diagnosis not present

## 2015-10-23 DIAGNOSIS — E785 Hyperlipidemia, unspecified: Secondary | ICD-10-CM | POA: Diagnosis not present

## 2015-10-23 DIAGNOSIS — K219 Gastro-esophageal reflux disease without esophagitis: Secondary | ICD-10-CM | POA: Diagnosis not present

## 2015-10-23 DIAGNOSIS — Z7982 Long term (current) use of aspirin: Secondary | ICD-10-CM | POA: Diagnosis not present

## 2015-10-23 DIAGNOSIS — N186 End stage renal disease: Secondary | ICD-10-CM | POA: Diagnosis not present

## 2015-10-23 DIAGNOSIS — E860 Dehydration: Secondary | ICD-10-CM | POA: Diagnosis not present

## 2015-10-23 DIAGNOSIS — D8989 Other specified disorders involving the immune mechanism, not elsewhere classified: Secondary | ICD-10-CM | POA: Diagnosis not present

## 2015-10-23 DIAGNOSIS — I120 Hypertensive chronic kidney disease with stage 5 chronic kidney disease or end stage renal disease: Secondary | ICD-10-CM | POA: Diagnosis not present

## 2015-10-23 DIAGNOSIS — Z4822 Encounter for aftercare following kidney transplant: Secondary | ICD-10-CM | POA: Diagnosis not present

## 2015-10-23 DIAGNOSIS — T8619 Other complication of kidney transplant: Secondary | ICD-10-CM | POA: Diagnosis not present

## 2015-10-23 DIAGNOSIS — I2510 Atherosclerotic heart disease of native coronary artery without angina pectoris: Secondary | ICD-10-CM | POA: Diagnosis not present

## 2015-10-23 DIAGNOSIS — Z79899 Other long term (current) drug therapy: Secondary | ICD-10-CM | POA: Diagnosis not present

## 2015-10-27 DIAGNOSIS — I770 Arteriovenous fistula, acquired: Secondary | ICD-10-CM | POA: Diagnosis not present

## 2015-10-27 DIAGNOSIS — I2510 Atherosclerotic heart disease of native coronary artery without angina pectoris: Secondary | ICD-10-CM | POA: Diagnosis not present

## 2015-10-27 DIAGNOSIS — I120 Hypertensive chronic kidney disease with stage 5 chronic kidney disease or end stage renal disease: Secondary | ICD-10-CM | POA: Diagnosis not present

## 2015-10-27 DIAGNOSIS — N186 End stage renal disease: Secondary | ICD-10-CM | POA: Diagnosis not present

## 2015-10-27 DIAGNOSIS — D8989 Other specified disorders involving the immune mechanism, not elsewhere classified: Secondary | ICD-10-CM | POA: Diagnosis not present

## 2015-10-27 DIAGNOSIS — I10 Essential (primary) hypertension: Secondary | ICD-10-CM | POA: Diagnosis not present

## 2015-10-27 DIAGNOSIS — Z992 Dependence on renal dialysis: Secondary | ICD-10-CM | POA: Diagnosis not present

## 2015-10-27 DIAGNOSIS — D899 Disorder involving the immune mechanism, unspecified: Secondary | ICD-10-CM | POA: Diagnosis not present

## 2015-10-27 DIAGNOSIS — Z940 Kidney transplant status: Secondary | ICD-10-CM | POA: Diagnosis not present

## 2015-10-27 DIAGNOSIS — Z79899 Other long term (current) drug therapy: Secondary | ICD-10-CM | POA: Diagnosis not present

## 2015-10-27 DIAGNOSIS — Z720 Tobacco use: Secondary | ICD-10-CM | POA: Diagnosis not present

## 2015-10-27 DIAGNOSIS — Z4822 Encounter for aftercare following kidney transplant: Secondary | ICD-10-CM | POA: Diagnosis not present

## 2015-10-27 DIAGNOSIS — T8619 Other complication of kidney transplant: Secondary | ICD-10-CM | POA: Diagnosis not present

## 2015-10-27 DIAGNOSIS — Z466 Encounter for fitting and adjustment of urinary device: Secondary | ICD-10-CM | POA: Diagnosis not present

## 2015-10-27 DIAGNOSIS — Z7982 Long term (current) use of aspirin: Secondary | ICD-10-CM | POA: Diagnosis not present

## 2015-10-27 DIAGNOSIS — E785 Hyperlipidemia, unspecified: Secondary | ICD-10-CM | POA: Diagnosis not present

## 2015-10-27 DIAGNOSIS — J90 Pleural effusion, not elsewhere classified: Secondary | ICD-10-CM | POA: Diagnosis not present

## 2015-10-27 DIAGNOSIS — D631 Anemia in chronic kidney disease: Secondary | ICD-10-CM | POA: Diagnosis not present

## 2015-10-30 DIAGNOSIS — E785 Hyperlipidemia, unspecified: Secondary | ICD-10-CM | POA: Diagnosis not present

## 2015-10-30 DIAGNOSIS — Z992 Dependence on renal dialysis: Secondary | ICD-10-CM | POA: Diagnosis not present

## 2015-10-30 DIAGNOSIS — Z7952 Long term (current) use of systemic steroids: Secondary | ICD-10-CM | POA: Diagnosis not present

## 2015-10-30 DIAGNOSIS — I120 Hypertensive chronic kidney disease with stage 5 chronic kidney disease or end stage renal disease: Secondary | ICD-10-CM | POA: Diagnosis not present

## 2015-10-30 DIAGNOSIS — D649 Anemia, unspecified: Secondary | ICD-10-CM | POA: Diagnosis not present

## 2015-10-30 DIAGNOSIS — Z940 Kidney transplant status: Secondary | ICD-10-CM | POA: Diagnosis not present

## 2015-10-30 DIAGNOSIS — N186 End stage renal disease: Secondary | ICD-10-CM | POA: Diagnosis not present

## 2015-10-30 DIAGNOSIS — D899 Disorder involving the immune mechanism, unspecified: Secondary | ICD-10-CM | POA: Diagnosis not present

## 2015-10-30 DIAGNOSIS — D8989 Other specified disorders involving the immune mechanism, not elsewhere classified: Secondary | ICD-10-CM | POA: Diagnosis not present

## 2015-10-30 DIAGNOSIS — Z79899 Other long term (current) drug therapy: Secondary | ICD-10-CM | POA: Diagnosis not present

## 2015-10-30 DIAGNOSIS — I2510 Atherosclerotic heart disease of native coronary artery without angina pectoris: Secondary | ICD-10-CM | POA: Diagnosis not present

## 2015-10-30 DIAGNOSIS — Z7982 Long term (current) use of aspirin: Secondary | ICD-10-CM | POA: Diagnosis not present

## 2015-10-30 DIAGNOSIS — Z960 Presence of urogenital implants: Secondary | ICD-10-CM | POA: Diagnosis not present

## 2015-10-30 DIAGNOSIS — I10 Essential (primary) hypertension: Secondary | ICD-10-CM | POA: Diagnosis not present

## 2015-11-03 DIAGNOSIS — N186 End stage renal disease: Secondary | ICD-10-CM | POA: Diagnosis not present

## 2015-11-03 DIAGNOSIS — E785 Hyperlipidemia, unspecified: Secondary | ICD-10-CM | POA: Diagnosis not present

## 2015-11-03 DIAGNOSIS — I120 Hypertensive chronic kidney disease with stage 5 chronic kidney disease or end stage renal disease: Secondary | ICD-10-CM | POA: Diagnosis not present

## 2015-11-03 DIAGNOSIS — Z79899 Other long term (current) drug therapy: Secondary | ICD-10-CM | POA: Diagnosis not present

## 2015-11-03 DIAGNOSIS — D899 Disorder involving the immune mechanism, unspecified: Secondary | ICD-10-CM | POA: Diagnosis not present

## 2015-11-03 DIAGNOSIS — Z992 Dependence on renal dialysis: Secondary | ICD-10-CM | POA: Diagnosis not present

## 2015-11-03 DIAGNOSIS — I2510 Atherosclerotic heart disease of native coronary artery without angina pectoris: Secondary | ICD-10-CM | POA: Diagnosis not present

## 2015-11-03 DIAGNOSIS — Z7982 Long term (current) use of aspirin: Secondary | ICD-10-CM | POA: Diagnosis not present

## 2015-11-11 DIAGNOSIS — D649 Anemia, unspecified: Secondary | ICD-10-CM | POA: Diagnosis not present

## 2015-11-11 DIAGNOSIS — E875 Hyperkalemia: Secondary | ICD-10-CM | POA: Diagnosis not present

## 2015-11-11 DIAGNOSIS — Z7982 Long term (current) use of aspirin: Secondary | ICD-10-CM | POA: Diagnosis not present

## 2015-11-11 DIAGNOSIS — Z79899 Other long term (current) drug therapy: Secondary | ICD-10-CM | POA: Diagnosis not present

## 2015-11-11 DIAGNOSIS — Z940 Kidney transplant status: Secondary | ICD-10-CM | POA: Diagnosis not present

## 2015-11-11 DIAGNOSIS — N186 End stage renal disease: Secondary | ICD-10-CM | POA: Diagnosis not present

## 2015-11-11 DIAGNOSIS — T8619 Other complication of kidney transplant: Secondary | ICD-10-CM | POA: Diagnosis not present

## 2015-11-11 DIAGNOSIS — I120 Hypertensive chronic kidney disease with stage 5 chronic kidney disease or end stage renal disease: Secondary | ICD-10-CM | POA: Diagnosis not present

## 2015-11-11 DIAGNOSIS — I10 Essential (primary) hypertension: Secondary | ICD-10-CM | POA: Diagnosis not present

## 2015-11-11 DIAGNOSIS — E785 Hyperlipidemia, unspecified: Secondary | ICD-10-CM | POA: Diagnosis not present

## 2015-11-11 DIAGNOSIS — Z4822 Encounter for aftercare following kidney transplant: Secondary | ICD-10-CM | POA: Diagnosis not present

## 2015-11-11 DIAGNOSIS — I2510 Atherosclerotic heart disease of native coronary artery without angina pectoris: Secondary | ICD-10-CM | POA: Diagnosis not present

## 2015-11-11 DIAGNOSIS — E872 Acidosis: Secondary | ICD-10-CM | POA: Diagnosis not present

## 2015-11-11 DIAGNOSIS — Z992 Dependence on renal dialysis: Secondary | ICD-10-CM | POA: Diagnosis not present

## 2015-11-11 DIAGNOSIS — D899 Disorder involving the immune mechanism, unspecified: Secondary | ICD-10-CM | POA: Diagnosis not present

## 2015-11-11 DIAGNOSIS — D8989 Other specified disorders involving the immune mechanism, not elsewhere classified: Secondary | ICD-10-CM | POA: Diagnosis not present

## 2015-11-13 DIAGNOSIS — Z940 Kidney transplant status: Secondary | ICD-10-CM | POA: Diagnosis not present

## 2015-11-17 DIAGNOSIS — D899 Disorder involving the immune mechanism, unspecified: Secondary | ICD-10-CM | POA: Diagnosis not present

## 2015-11-17 DIAGNOSIS — N186 End stage renal disease: Secondary | ICD-10-CM | POA: Diagnosis not present

## 2015-11-17 DIAGNOSIS — D8989 Other specified disorders involving the immune mechanism, not elsewhere classified: Secondary | ICD-10-CM | POA: Diagnosis not present

## 2015-11-17 DIAGNOSIS — Z940 Kidney transplant status: Secondary | ICD-10-CM | POA: Diagnosis not present

## 2015-11-17 DIAGNOSIS — E875 Hyperkalemia: Secondary | ICD-10-CM | POA: Diagnosis not present

## 2015-11-17 DIAGNOSIS — E872 Acidosis: Secondary | ICD-10-CM | POA: Diagnosis not present

## 2015-11-17 DIAGNOSIS — I129 Hypertensive chronic kidney disease with stage 1 through stage 4 chronic kidney disease, or unspecified chronic kidney disease: Secondary | ICD-10-CM | POA: Diagnosis not present

## 2015-11-17 DIAGNOSIS — I2510 Atherosclerotic heart disease of native coronary artery without angina pectoris: Secondary | ICD-10-CM | POA: Diagnosis not present

## 2015-11-17 DIAGNOSIS — N189 Chronic kidney disease, unspecified: Secondary | ICD-10-CM | POA: Diagnosis not present

## 2015-11-17 DIAGNOSIS — Z7982 Long term (current) use of aspirin: Secondary | ICD-10-CM | POA: Diagnosis not present

## 2015-11-17 DIAGNOSIS — I120 Hypertensive chronic kidney disease with stage 5 chronic kidney disease or end stage renal disease: Secondary | ICD-10-CM | POA: Diagnosis not present

## 2015-11-17 DIAGNOSIS — D631 Anemia in chronic kidney disease: Secondary | ICD-10-CM | POA: Diagnosis not present

## 2015-11-17 DIAGNOSIS — Z79899 Other long term (current) drug therapy: Secondary | ICD-10-CM | POA: Diagnosis not present

## 2015-11-17 DIAGNOSIS — Z992 Dependence on renal dialysis: Secondary | ICD-10-CM | POA: Diagnosis not present

## 2015-11-24 DIAGNOSIS — Z7982 Long term (current) use of aspirin: Secondary | ICD-10-CM | POA: Diagnosis not present

## 2015-11-24 DIAGNOSIS — I2510 Atherosclerotic heart disease of native coronary artery without angina pectoris: Secondary | ICD-10-CM | POA: Diagnosis not present

## 2015-11-24 DIAGNOSIS — Z4822 Encounter for aftercare following kidney transplant: Secondary | ICD-10-CM | POA: Diagnosis not present

## 2015-11-24 DIAGNOSIS — D899 Disorder involving the immune mechanism, unspecified: Secondary | ICD-10-CM | POA: Diagnosis not present

## 2015-11-24 DIAGNOSIS — Z992 Dependence on renal dialysis: Secondary | ICD-10-CM | POA: Diagnosis not present

## 2015-11-24 DIAGNOSIS — Z79899 Other long term (current) drug therapy: Secondary | ICD-10-CM | POA: Diagnosis not present

## 2015-11-24 DIAGNOSIS — I120 Hypertensive chronic kidney disease with stage 5 chronic kidney disease or end stage renal disease: Secondary | ICD-10-CM | POA: Diagnosis not present

## 2015-11-24 DIAGNOSIS — E785 Hyperlipidemia, unspecified: Secondary | ICD-10-CM | POA: Diagnosis not present

## 2015-11-24 DIAGNOSIS — D8989 Other specified disorders involving the immune mechanism, not elsewhere classified: Secondary | ICD-10-CM | POA: Diagnosis not present

## 2015-11-24 DIAGNOSIS — I10 Essential (primary) hypertension: Secondary | ICD-10-CM | POA: Diagnosis not present

## 2015-11-24 DIAGNOSIS — N186 End stage renal disease: Secondary | ICD-10-CM | POA: Diagnosis not present

## 2015-11-24 DIAGNOSIS — Z940 Kidney transplant status: Secondary | ICD-10-CM | POA: Diagnosis not present

## 2015-12-01 DIAGNOSIS — Z992 Dependence on renal dialysis: Secondary | ICD-10-CM | POA: Diagnosis not present

## 2015-12-01 DIAGNOSIS — E785 Hyperlipidemia, unspecified: Secondary | ICD-10-CM | POA: Diagnosis not present

## 2015-12-01 DIAGNOSIS — D8989 Other specified disorders involving the immune mechanism, not elsewhere classified: Secondary | ICD-10-CM | POA: Diagnosis not present

## 2015-12-01 DIAGNOSIS — I10 Essential (primary) hypertension: Secondary | ICD-10-CM | POA: Diagnosis not present

## 2015-12-01 DIAGNOSIS — Z87891 Personal history of nicotine dependence: Secondary | ICD-10-CM | POA: Diagnosis not present

## 2015-12-01 DIAGNOSIS — Z79899 Other long term (current) drug therapy: Secondary | ICD-10-CM | POA: Diagnosis not present

## 2015-12-01 DIAGNOSIS — I120 Hypertensive chronic kidney disease with stage 5 chronic kidney disease or end stage renal disease: Secondary | ICD-10-CM | POA: Diagnosis not present

## 2015-12-01 DIAGNOSIS — D899 Disorder involving the immune mechanism, unspecified: Secondary | ICD-10-CM | POA: Diagnosis not present

## 2015-12-01 DIAGNOSIS — Z4822 Encounter for aftercare following kidney transplant: Secondary | ICD-10-CM | POA: Diagnosis not present

## 2015-12-01 DIAGNOSIS — N186 End stage renal disease: Secondary | ICD-10-CM | POA: Diagnosis not present

## 2015-12-01 DIAGNOSIS — Z7982 Long term (current) use of aspirin: Secondary | ICD-10-CM | POA: Diagnosis not present

## 2015-12-01 DIAGNOSIS — I2510 Atherosclerotic heart disease of native coronary artery without angina pectoris: Secondary | ICD-10-CM | POA: Diagnosis not present

## 2015-12-01 DIAGNOSIS — D649 Anemia, unspecified: Secondary | ICD-10-CM | POA: Diagnosis not present

## 2015-12-01 DIAGNOSIS — E872 Acidosis: Secondary | ICD-10-CM | POA: Diagnosis not present

## 2015-12-01 DIAGNOSIS — Z940 Kidney transplant status: Secondary | ICD-10-CM | POA: Diagnosis not present

## 2015-12-15 DIAGNOSIS — D8989 Other specified disorders involving the immune mechanism, not elsewhere classified: Secondary | ICD-10-CM | POA: Diagnosis not present

## 2015-12-15 DIAGNOSIS — I120 Hypertensive chronic kidney disease with stage 5 chronic kidney disease or end stage renal disease: Secondary | ICD-10-CM | POA: Diagnosis not present

## 2015-12-15 DIAGNOSIS — E785 Hyperlipidemia, unspecified: Secondary | ICD-10-CM | POA: Diagnosis not present

## 2015-12-15 DIAGNOSIS — D899 Disorder involving the immune mechanism, unspecified: Secondary | ICD-10-CM | POA: Diagnosis not present

## 2015-12-15 DIAGNOSIS — Z4822 Encounter for aftercare following kidney transplant: Secondary | ICD-10-CM | POA: Diagnosis not present

## 2015-12-15 DIAGNOSIS — Z7982 Long term (current) use of aspirin: Secondary | ICD-10-CM | POA: Diagnosis not present

## 2015-12-15 DIAGNOSIS — Z79899 Other long term (current) drug therapy: Secondary | ICD-10-CM | POA: Diagnosis not present

## 2015-12-15 DIAGNOSIS — D649 Anemia, unspecified: Secondary | ICD-10-CM | POA: Diagnosis not present

## 2015-12-15 DIAGNOSIS — E872 Acidosis: Secondary | ICD-10-CM | POA: Diagnosis not present

## 2015-12-15 DIAGNOSIS — N186 End stage renal disease: Secondary | ICD-10-CM | POA: Diagnosis not present

## 2015-12-15 DIAGNOSIS — Z940 Kidney transplant status: Secondary | ICD-10-CM | POA: Diagnosis not present

## 2015-12-15 DIAGNOSIS — I2510 Atherosclerotic heart disease of native coronary artery without angina pectoris: Secondary | ICD-10-CM | POA: Diagnosis not present

## 2015-12-29 DIAGNOSIS — I120 Hypertensive chronic kidney disease with stage 5 chronic kidney disease or end stage renal disease: Secondary | ICD-10-CM | POA: Diagnosis not present

## 2015-12-29 DIAGNOSIS — I10 Essential (primary) hypertension: Secondary | ICD-10-CM | POA: Diagnosis not present

## 2015-12-29 DIAGNOSIS — Z4822 Encounter for aftercare following kidney transplant: Secondary | ICD-10-CM | POA: Diagnosis not present

## 2015-12-29 DIAGNOSIS — Z7982 Long term (current) use of aspirin: Secondary | ICD-10-CM | POA: Diagnosis not present

## 2015-12-29 DIAGNOSIS — T8619 Other complication of kidney transplant: Secondary | ICD-10-CM | POA: Diagnosis not present

## 2015-12-29 DIAGNOSIS — E875 Hyperkalemia: Secondary | ICD-10-CM | POA: Diagnosis not present

## 2015-12-29 DIAGNOSIS — D8989 Other specified disorders involving the immune mechanism, not elsewhere classified: Secondary | ICD-10-CM | POA: Diagnosis not present

## 2015-12-29 DIAGNOSIS — N186 End stage renal disease: Secondary | ICD-10-CM | POA: Diagnosis not present

## 2015-12-29 DIAGNOSIS — Z940 Kidney transplant status: Secondary | ICD-10-CM | POA: Diagnosis not present

## 2015-12-29 DIAGNOSIS — D649 Anemia, unspecified: Secondary | ICD-10-CM | POA: Diagnosis not present

## 2015-12-29 DIAGNOSIS — Z992 Dependence on renal dialysis: Secondary | ICD-10-CM | POA: Diagnosis not present

## 2015-12-29 DIAGNOSIS — D899 Disorder involving the immune mechanism, unspecified: Secondary | ICD-10-CM | POA: Diagnosis not present

## 2015-12-29 DIAGNOSIS — E785 Hyperlipidemia, unspecified: Secondary | ICD-10-CM | POA: Diagnosis not present

## 2015-12-29 DIAGNOSIS — F17210 Nicotine dependence, cigarettes, uncomplicated: Secondary | ICD-10-CM | POA: Diagnosis not present

## 2015-12-29 DIAGNOSIS — E872 Acidosis: Secondary | ICD-10-CM | POA: Diagnosis not present

## 2015-12-29 DIAGNOSIS — I2510 Atherosclerotic heart disease of native coronary artery without angina pectoris: Secondary | ICD-10-CM | POA: Diagnosis not present

## 2015-12-29 DIAGNOSIS — Z716 Tobacco abuse counseling: Secondary | ICD-10-CM | POA: Diagnosis not present

## 2015-12-29 DIAGNOSIS — Z87891 Personal history of nicotine dependence: Secondary | ICD-10-CM | POA: Diagnosis not present

## 2015-12-29 DIAGNOSIS — Z79899 Other long term (current) drug therapy: Secondary | ICD-10-CM | POA: Diagnosis not present

## 2016-01-06 DIAGNOSIS — Z940 Kidney transplant status: Secondary | ICD-10-CM | POA: Diagnosis not present

## 2016-01-12 DIAGNOSIS — Z940 Kidney transplant status: Secondary | ICD-10-CM | POA: Diagnosis not present

## 2016-02-02 DIAGNOSIS — Z87891 Personal history of nicotine dependence: Secondary | ICD-10-CM | POA: Diagnosis not present

## 2016-02-02 DIAGNOSIS — I2510 Atherosclerotic heart disease of native coronary artery without angina pectoris: Secondary | ICD-10-CM | POA: Diagnosis not present

## 2016-02-02 DIAGNOSIS — E872 Acidosis: Secondary | ICD-10-CM | POA: Diagnosis not present

## 2016-02-02 DIAGNOSIS — Z992 Dependence on renal dialysis: Secondary | ICD-10-CM | POA: Diagnosis not present

## 2016-02-02 DIAGNOSIS — D899 Disorder involving the immune mechanism, unspecified: Secondary | ICD-10-CM | POA: Diagnosis not present

## 2016-02-02 DIAGNOSIS — I120 Hypertensive chronic kidney disease with stage 5 chronic kidney disease or end stage renal disease: Secondary | ICD-10-CM | POA: Diagnosis not present

## 2016-02-02 DIAGNOSIS — E785 Hyperlipidemia, unspecified: Secondary | ICD-10-CM | POA: Diagnosis not present

## 2016-02-02 DIAGNOSIS — T8619 Other complication of kidney transplant: Secondary | ICD-10-CM | POA: Diagnosis not present

## 2016-02-02 DIAGNOSIS — Z940 Kidney transplant status: Secondary | ICD-10-CM | POA: Diagnosis not present

## 2016-02-02 DIAGNOSIS — D631 Anemia in chronic kidney disease: Secondary | ICD-10-CM | POA: Diagnosis not present

## 2016-02-02 DIAGNOSIS — E875 Hyperkalemia: Secondary | ICD-10-CM | POA: Diagnosis not present

## 2016-02-02 DIAGNOSIS — N183 Chronic kidney disease, stage 3 (moderate): Secondary | ICD-10-CM | POA: Diagnosis not present

## 2016-02-02 DIAGNOSIS — K219 Gastro-esophageal reflux disease without esophagitis: Secondary | ICD-10-CM | POA: Diagnosis not present

## 2016-02-02 DIAGNOSIS — H2513 Age-related nuclear cataract, bilateral: Secondary | ICD-10-CM | POA: Diagnosis not present

## 2016-02-02 DIAGNOSIS — N186 End stage renal disease: Secondary | ICD-10-CM | POA: Diagnosis not present

## 2016-02-02 DIAGNOSIS — Z7982 Long term (current) use of aspirin: Secondary | ICD-10-CM | POA: Diagnosis not present

## 2016-02-02 DIAGNOSIS — Z79899 Other long term (current) drug therapy: Secondary | ICD-10-CM | POA: Diagnosis not present

## 2016-02-02 DIAGNOSIS — D8989 Other specified disorders involving the immune mechanism, not elsewhere classified: Secondary | ICD-10-CM | POA: Diagnosis not present

## 2016-02-02 DIAGNOSIS — Z4822 Encounter for aftercare following kidney transplant: Secondary | ICD-10-CM | POA: Diagnosis not present

## 2016-02-03 DIAGNOSIS — Z4822 Encounter for aftercare following kidney transplant: Secondary | ICD-10-CM | POA: Diagnosis not present

## 2016-02-03 DIAGNOSIS — Z7982 Long term (current) use of aspirin: Secondary | ICD-10-CM | POA: Diagnosis not present

## 2016-02-03 DIAGNOSIS — Z992 Dependence on renal dialysis: Secondary | ICD-10-CM | POA: Diagnosis not present

## 2016-02-03 DIAGNOSIS — N186 End stage renal disease: Secondary | ICD-10-CM | POA: Diagnosis not present

## 2016-02-03 DIAGNOSIS — Z79899 Other long term (current) drug therapy: Secondary | ICD-10-CM | POA: Diagnosis not present

## 2016-02-03 DIAGNOSIS — F17210 Nicotine dependence, cigarettes, uncomplicated: Secondary | ICD-10-CM | POA: Diagnosis not present

## 2016-02-03 DIAGNOSIS — I2510 Atherosclerotic heart disease of native coronary artery without angina pectoris: Secondary | ICD-10-CM | POA: Diagnosis not present

## 2016-02-10 DIAGNOSIS — H2513 Age-related nuclear cataract, bilateral: Secondary | ICD-10-CM | POA: Diagnosis not present

## 2016-02-10 DIAGNOSIS — H35033 Hypertensive retinopathy, bilateral: Secondary | ICD-10-CM | POA: Diagnosis not present

## 2016-02-10 DIAGNOSIS — H353211 Exudative age-related macular degeneration, right eye, with active choroidal neovascularization: Secondary | ICD-10-CM | POA: Diagnosis not present

## 2016-03-01 DIAGNOSIS — Z4822 Encounter for aftercare following kidney transplant: Secondary | ICD-10-CM | POA: Diagnosis not present

## 2016-03-01 DIAGNOSIS — Z87891 Personal history of nicotine dependence: Secondary | ICD-10-CM | POA: Diagnosis not present

## 2016-03-01 DIAGNOSIS — D631 Anemia in chronic kidney disease: Secondary | ICD-10-CM | POA: Diagnosis not present

## 2016-03-01 DIAGNOSIS — I10 Essential (primary) hypertension: Secondary | ICD-10-CM | POA: Diagnosis not present

## 2016-03-01 DIAGNOSIS — Z940 Kidney transplant status: Secondary | ICD-10-CM | POA: Diagnosis not present

## 2016-03-01 DIAGNOSIS — I2510 Atherosclerotic heart disease of native coronary artery without angina pectoris: Secondary | ICD-10-CM | POA: Diagnosis not present

## 2016-03-01 DIAGNOSIS — E785 Hyperlipidemia, unspecified: Secondary | ICD-10-CM | POA: Diagnosis not present

## 2016-03-01 DIAGNOSIS — D8989 Other specified disorders involving the immune mechanism, not elsewhere classified: Secondary | ICD-10-CM | POA: Diagnosis not present

## 2016-03-01 DIAGNOSIS — Z7982 Long term (current) use of aspirin: Secondary | ICD-10-CM | POA: Diagnosis not present

## 2016-03-01 DIAGNOSIS — I129 Hypertensive chronic kidney disease with stage 1 through stage 4 chronic kidney disease, or unspecified chronic kidney disease: Secondary | ICD-10-CM | POA: Diagnosis not present

## 2016-03-01 DIAGNOSIS — N183 Chronic kidney disease, stage 3 (moderate): Secondary | ICD-10-CM | POA: Diagnosis not present

## 2016-03-01 DIAGNOSIS — Z79899 Other long term (current) drug therapy: Secondary | ICD-10-CM | POA: Diagnosis not present

## 2016-03-01 DIAGNOSIS — B349 Viral infection, unspecified: Secondary | ICD-10-CM | POA: Diagnosis not present

## 2016-03-16 DIAGNOSIS — H353211 Exudative age-related macular degeneration, right eye, with active choroidal neovascularization: Secondary | ICD-10-CM | POA: Diagnosis not present

## 2016-04-13 DIAGNOSIS — D899 Disorder involving the immune mechanism, unspecified: Secondary | ICD-10-CM | POA: Diagnosis not present

## 2016-04-13 DIAGNOSIS — I120 Hypertensive chronic kidney disease with stage 5 chronic kidney disease or end stage renal disease: Secondary | ICD-10-CM | POA: Diagnosis not present

## 2016-04-13 DIAGNOSIS — B9789 Other viral agents as the cause of diseases classified elsewhere: Secondary | ICD-10-CM | POA: Diagnosis not present

## 2016-04-13 DIAGNOSIS — N186 End stage renal disease: Secondary | ICD-10-CM | POA: Diagnosis not present

## 2016-04-13 DIAGNOSIS — Z7982 Long term (current) use of aspirin: Secondary | ICD-10-CM | POA: Diagnosis not present

## 2016-04-13 DIAGNOSIS — E875 Hyperkalemia: Secondary | ICD-10-CM | POA: Diagnosis not present

## 2016-04-13 DIAGNOSIS — D631 Anemia in chronic kidney disease: Secondary | ICD-10-CM | POA: Diagnosis not present

## 2016-04-13 DIAGNOSIS — Z79899 Other long term (current) drug therapy: Secondary | ICD-10-CM | POA: Diagnosis not present

## 2016-04-13 DIAGNOSIS — Z992 Dependence on renal dialysis: Secondary | ICD-10-CM | POA: Diagnosis not present

## 2016-04-13 DIAGNOSIS — E872 Acidosis: Secondary | ICD-10-CM | POA: Diagnosis not present

## 2016-04-13 DIAGNOSIS — Z4822 Encounter for aftercare following kidney transplant: Secondary | ICD-10-CM | POA: Diagnosis not present

## 2016-04-13 DIAGNOSIS — I2510 Atherosclerotic heart disease of native coronary artery without angina pectoris: Secondary | ICD-10-CM | POA: Diagnosis not present

## 2016-04-13 DIAGNOSIS — D8989 Other specified disorders involving the immune mechanism, not elsewhere classified: Secondary | ICD-10-CM | POA: Diagnosis not present

## 2016-04-13 DIAGNOSIS — E785 Hyperlipidemia, unspecified: Secondary | ICD-10-CM | POA: Diagnosis not present

## 2016-04-13 DIAGNOSIS — Z940 Kidney transplant status: Secondary | ICD-10-CM | POA: Diagnosis not present

## 2016-04-27 DIAGNOSIS — Z940 Kidney transplant status: Secondary | ICD-10-CM | POA: Diagnosis not present

## 2016-04-30 DIAGNOSIS — Z7982 Long term (current) use of aspirin: Secondary | ICD-10-CM | POA: Diagnosis not present

## 2016-04-30 DIAGNOSIS — Z4822 Encounter for aftercare following kidney transplant: Secondary | ICD-10-CM | POA: Diagnosis not present

## 2016-04-30 DIAGNOSIS — Z79899 Other long term (current) drug therapy: Secondary | ICD-10-CM | POA: Diagnosis not present

## 2016-05-11 DIAGNOSIS — E875 Hyperkalemia: Secondary | ICD-10-CM | POA: Diagnosis not present

## 2016-05-11 DIAGNOSIS — B349 Viral infection, unspecified: Secondary | ICD-10-CM | POA: Diagnosis not present

## 2016-05-11 DIAGNOSIS — Z992 Dependence on renal dialysis: Secondary | ICD-10-CM | POA: Diagnosis not present

## 2016-05-11 DIAGNOSIS — I120 Hypertensive chronic kidney disease with stage 5 chronic kidney disease or end stage renal disease: Secondary | ICD-10-CM | POA: Diagnosis not present

## 2016-05-11 DIAGNOSIS — E869 Volume depletion, unspecified: Secondary | ICD-10-CM | POA: Diagnosis not present

## 2016-05-11 DIAGNOSIS — E785 Hyperlipidemia, unspecified: Secondary | ICD-10-CM | POA: Diagnosis not present

## 2016-05-11 DIAGNOSIS — I10 Essential (primary) hypertension: Secondary | ICD-10-CM | POA: Diagnosis not present

## 2016-05-11 DIAGNOSIS — B999 Unspecified infectious disease: Secondary | ICD-10-CM | POA: Diagnosis not present

## 2016-05-11 DIAGNOSIS — E876 Hypokalemia: Secondary | ICD-10-CM | POA: Diagnosis not present

## 2016-05-11 DIAGNOSIS — Z79899 Other long term (current) drug therapy: Secondary | ICD-10-CM | POA: Diagnosis not present

## 2016-05-11 DIAGNOSIS — R5383 Other fatigue: Secondary | ICD-10-CM | POA: Diagnosis not present

## 2016-05-11 DIAGNOSIS — Z4822 Encounter for aftercare following kidney transplant: Secondary | ICD-10-CM | POA: Diagnosis not present

## 2016-05-11 DIAGNOSIS — D8989 Other specified disorders involving the immune mechanism, not elsewhere classified: Secondary | ICD-10-CM | POA: Diagnosis not present

## 2016-05-11 DIAGNOSIS — D899 Disorder involving the immune mechanism, unspecified: Secondary | ICD-10-CM | POA: Diagnosis not present

## 2016-05-11 DIAGNOSIS — N186 End stage renal disease: Secondary | ICD-10-CM | POA: Diagnosis not present

## 2016-05-11 DIAGNOSIS — D649 Anemia, unspecified: Secondary | ICD-10-CM | POA: Diagnosis not present

## 2016-05-11 DIAGNOSIS — Z940 Kidney transplant status: Secondary | ICD-10-CM | POA: Diagnosis not present

## 2016-05-11 DIAGNOSIS — E871 Hypo-osmolality and hyponatremia: Secondary | ICD-10-CM | POA: Diagnosis not present

## 2016-05-11 DIAGNOSIS — Z7982 Long term (current) use of aspirin: Secondary | ICD-10-CM | POA: Diagnosis not present

## 2016-05-11 DIAGNOSIS — I2510 Atherosclerotic heart disease of native coronary artery without angina pectoris: Secondary | ICD-10-CM | POA: Diagnosis not present

## 2016-05-11 DIAGNOSIS — B9789 Other viral agents as the cause of diseases classified elsewhere: Secondary | ICD-10-CM | POA: Diagnosis not present

## 2016-05-11 DIAGNOSIS — E872 Acidosis: Secondary | ICD-10-CM | POA: Diagnosis not present

## 2016-05-13 DIAGNOSIS — Z6825 Body mass index (BMI) 25.0-25.9, adult: Secondary | ICD-10-CM | POA: Diagnosis not present

## 2016-05-13 DIAGNOSIS — Z940 Kidney transplant status: Secondary | ICD-10-CM | POA: Diagnosis not present

## 2016-05-18 DIAGNOSIS — H353211 Exudative age-related macular degeneration, right eye, with active choroidal neovascularization: Secondary | ICD-10-CM | POA: Diagnosis not present

## 2016-06-08 DIAGNOSIS — Z940 Kidney transplant status: Secondary | ICD-10-CM | POA: Diagnosis not present

## 2016-06-08 DIAGNOSIS — D8989 Other specified disorders involving the immune mechanism, not elsewhere classified: Secondary | ICD-10-CM | POA: Diagnosis not present

## 2016-06-08 DIAGNOSIS — Z79899 Other long term (current) drug therapy: Secondary | ICD-10-CM | POA: Diagnosis not present

## 2016-06-08 DIAGNOSIS — B349 Viral infection, unspecified: Secondary | ICD-10-CM | POA: Diagnosis not present

## 2016-06-08 DIAGNOSIS — Z792 Long term (current) use of antibiotics: Secondary | ICD-10-CM | POA: Diagnosis not present

## 2016-06-08 DIAGNOSIS — D649 Anemia, unspecified: Secondary | ICD-10-CM | POA: Diagnosis not present

## 2016-06-08 DIAGNOSIS — Z992 Dependence on renal dialysis: Secondary | ICD-10-CM | POA: Diagnosis not present

## 2016-06-08 DIAGNOSIS — I120 Hypertensive chronic kidney disease with stage 5 chronic kidney disease or end stage renal disease: Secondary | ICD-10-CM | POA: Diagnosis not present

## 2016-06-08 DIAGNOSIS — E872 Acidosis: Secondary | ICD-10-CM | POA: Diagnosis not present

## 2016-06-08 DIAGNOSIS — D899 Disorder involving the immune mechanism, unspecified: Secondary | ICD-10-CM | POA: Diagnosis not present

## 2016-06-08 DIAGNOSIS — I10 Essential (primary) hypertension: Secondary | ICD-10-CM | POA: Diagnosis not present

## 2016-06-08 DIAGNOSIS — I2510 Atherosclerotic heart disease of native coronary artery without angina pectoris: Secondary | ICD-10-CM | POA: Diagnosis not present

## 2016-06-08 DIAGNOSIS — N186 End stage renal disease: Secondary | ICD-10-CM | POA: Diagnosis not present

## 2016-06-08 DIAGNOSIS — E871 Hypo-osmolality and hyponatremia: Secondary | ICD-10-CM | POA: Diagnosis not present

## 2016-06-08 DIAGNOSIS — Z4822 Encounter for aftercare following kidney transplant: Secondary | ICD-10-CM | POA: Diagnosis not present

## 2016-06-08 DIAGNOSIS — E785 Hyperlipidemia, unspecified: Secondary | ICD-10-CM | POA: Diagnosis not present

## 2016-06-08 DIAGNOSIS — Z7982 Long term (current) use of aspirin: Secondary | ICD-10-CM | POA: Diagnosis not present

## 2016-06-08 DIAGNOSIS — Z7952 Long term (current) use of systemic steroids: Secondary | ICD-10-CM | POA: Diagnosis not present

## 2016-06-08 DIAGNOSIS — E875 Hyperkalemia: Secondary | ICD-10-CM | POA: Diagnosis not present

## 2016-06-08 DIAGNOSIS — R5383 Other fatigue: Secondary | ICD-10-CM | POA: Diagnosis not present

## 2017-08-24 ENCOUNTER — Other Ambulatory Visit: Payer: Self-pay | Admitting: Urology

## 2017-08-25 ENCOUNTER — Encounter (HOSPITAL_BASED_OUTPATIENT_CLINIC_OR_DEPARTMENT_OTHER): Payer: Self-pay

## 2017-08-25 ENCOUNTER — Other Ambulatory Visit: Payer: Self-pay

## 2017-08-25 NOTE — Progress Notes (Signed)
Spoke with:  Delrae Calvyn NPO:  After Midnight, no gum, candy, or mints , NO SMOKING Arrival time:  0845 Labs: Istat 8, EKG AM medications: Fleet enema, Prednisone, Dapsone, Amlodipine, Prograf, Atorvastatin, Myfortic, Sodium Bicarbonate Pre op orders: orders need second sign Ride home:  Audie Box (girlfriend) [phone]

## 2017-08-25 NOTE — Pre-Procedure Instructions (Signed)
Dr. Valma Cava reviewed Joel [name]' medical history, Cath reports, ECHO, patient is ok for surgery at surgery center, no new orders received at this time.

## 2017-08-31 NOTE — H&P (Signed)
H&P  Chief Complaint: Elevated PSA  History of Present Illness: 62 year old male referred by Dr. Edrick Oh seen for evaluation and management of elevated PSA. The patient had a PSA drawn on 05/05/2017 was 4.8. He is not aware of PSAs having been drawn before. Urologic history is significant for a penile implant placed at [facility] about 2 years ago for erectile dysfunction. He has had a renal transplant about 3 years ago. There is no family history of prostate cancer or other prostatic abnormalities.   At his 1st visit with me he was found to have a midline prostate nodule. It was recommended that he have a TRUS/Bx which he presents for today. He will have this under anesthesia, as he is intolerant of rectal exams.    Past Medical History:  Diagnosis Date  . Anemia   . CAD (coronary artery disease), native coronary artery    BMS RCA January 2013  . DDD (degenerative disc disease)   . ESRD (end stage renal disease) ([place])   . GERD (gastroesophageal reflux disease)   . Headache(784.0)    history of  . Heart murmur   . Hemodialysis patient [facility])    history of   . History of pleural effusion    small left  . History of pulmonary edema   . HTN (hypertension)   . Hyperparathyroidism   . Lupus    remission for 10-15 years  . Pericardial effusion without cardiac tamponade 08/18/2011  . Pneumonia   . Pseudoaneurysm ([place]) 2012   left ypper arm AV fistula, Dr. Oneida Alar    Past Surgical History:  Procedure Laterality Date  . AV FISTULA PLACEMENT    . KIDNEY TRANSPLANT  09/28/2014  . LEFT AND RIGHT HEART CATHETERIZATION WITH CORONARY ANGIOGRAM  08/18/2011   Procedure: LEFT AND RIGHT HEART CATHETERIZATION WITH CORONARY ANGIOGRAM;  Surgeon: Thayer Headings, MD;  Location: [facility] CATH LAB;  Service: Cardiovascular;;  . PENILE PROSTHESIS IMPLANT    . PERCUTANEOUS CORONARY STENT INTERVENTION (PCI-S)  08/18/2011   Procedure: PERCUTANEOUS CORONARY STENT INTERVENTION (PCI-S);  Surgeon:  Wellington Hampshire, MD;  Location: [facility] CATH LAB;  Service: Cardiovascular;;    Home Medications:  Allergies as of 08/31/2017      Reactions   Aspirin    REACTION: unable to take d/t kidney dx      Medication List    Notice   Cannot display discharge medications because the patient has not yet been admitted.     Allergies:  Allergies  Allergen Reactions  . Aspirin     REACTION: unable to take d/t kidney dx    History reviewed. No pertinent family history.  Social History:  reports that he has been smoking cigarettes.  He has a 6.25 pack-year smoking history. he has never used smokeless tobacco. He reports that he uses drugs. Drug: Marijuana. He reports that he does not drink alcohol.  ROS: A complete review of systems was performed.  All systems are negative except for pertinent findings as noted.  Physical Exam:  Vital signs in last 24 hours:   Constitutional:  Alert and oriented, No acute distress Cardiovascular: Regular rate and rhythm, No JVD Respiratory: Normal respiratory effort, Lungs clear bilaterally GI: Abdomen is soft, nontender, nondistended, no abdominal masses Genitourinary: No CVAT. Normal male phallus, testes are descended bilaterally and non-tender and without masses, scrotum is normal in appearance without lesions or masses, perineum is normal on inspection. Rectal: Normal sphincter tone, no rectal masses, prostate is non tender  and has a midline nodule. Lymphatic: No lymphadenopathy Neurologic: Grossly intact, no focal deficits Psychiatric: Normal mood and affect  Laboratory Data:  No results for input(s): WBC, HGB, HCT, PLT in the last 72 hours.  No results for input(s): NA, K, CL, GLUCOSE, BUN, CALCIUM, CREATININE in the last 72 hours.  Invalid input(s): CO3   No results found for this or any previous visit (from the past 24 hour(s)). No results found for this or any previous visit (from the past 240 hour(s)).  Renal Function: No results for  input(s): CREATININE in the last 168 hours. CrCl cannot be calculated (Patient's most recent lab result is older than the maximum 21 days allowed.).  Radiologic Imaging: No results found.  Impression/Assessment:  Nodular prostate with elevated PSA  Plan:  TRUS/Bx under anesthesia

## 2017-09-01 ENCOUNTER — Ambulatory Visit (HOSPITAL_COMMUNITY)
Admission: RE | Admit: 2017-09-01 | Discharge: 2017-09-01 | Disposition: A | Discharge status: Home / Self Care | Payer: 59 | Source: Ambulatory Visit | Attending: Urology | Admitting: Urology

## 2017-09-01 ENCOUNTER — Ambulatory Visit (HOSPITAL_BASED_OUTPATIENT_CLINIC_OR_DEPARTMENT_OTHER): Payer: 59 | Admitting: Anesthesiology

## 2017-09-01 ENCOUNTER — Ambulatory Visit (HOSPITAL_COMMUNITY): Payer: 59

## 2017-09-01 ENCOUNTER — Other Ambulatory Visit: Payer: Self-pay

## 2017-09-01 ENCOUNTER — Encounter (HOSPITAL_BASED_OUTPATIENT_CLINIC_OR_DEPARTMENT_OTHER): Payer: Self-pay | Admitting: *Deleted

## 2017-09-01 ENCOUNTER — Encounter (HOSPITAL_BASED_OUTPATIENT_CLINIC_OR_DEPARTMENT_OTHER)
Admission: RE | Disposition: A | Discharge status: Home / Self Care | Payer: Self-pay | Source: Ambulatory Visit | Attending: Urology

## 2017-09-01 ENCOUNTER — Ambulatory Visit (HOSPITAL_BASED_OUTPATIENT_CLINIC_OR_DEPARTMENT_OTHER)
Admission: RE | Admit: 2017-09-01 | Discharge: 2017-09-01 | Disposition: A | Discharge status: Home / Self Care | Payer: 59 | Source: Ambulatory Visit | Attending: Urology | Admitting: Urology

## 2017-09-01 DIAGNOSIS — Z79899 Other long term (current) drug therapy: Secondary | ICD-10-CM | POA: Diagnosis not present

## 2017-09-01 DIAGNOSIS — C61 Malignant neoplasm of prostate: Secondary | ICD-10-CM | POA: Insufficient documentation

## 2017-09-01 DIAGNOSIS — F17210 Nicotine dependence, cigarettes, uncomplicated: Secondary | ICD-10-CM | POA: Diagnosis not present

## 2017-09-01 DIAGNOSIS — N402 Nodular prostate without lower urinary tract symptoms: Secondary | ICD-10-CM | POA: Insufficient documentation

## 2017-09-01 DIAGNOSIS — I10 Essential (primary) hypertension: Secondary | ICD-10-CM | POA: Diagnosis not present

## 2017-09-01 DIAGNOSIS — Z940 Kidney transplant status: Secondary | ICD-10-CM | POA: Insufficient documentation

## 2017-09-01 DIAGNOSIS — E213 Hyperparathyroidism, unspecified: Secondary | ICD-10-CM | POA: Diagnosis not present

## 2017-09-01 DIAGNOSIS — I2510 Atherosclerotic heart disease of native coronary artery without angina pectoris: Secondary | ICD-10-CM | POA: Diagnosis not present

## 2017-09-01 DIAGNOSIS — K219 Gastro-esophageal reflux disease without esophagitis: Secondary | ICD-10-CM | POA: Diagnosis not present

## 2017-09-01 DIAGNOSIS — N400 Enlarged prostate without lower urinary tract symptoms: Secondary | ICD-10-CM

## 2017-09-01 DIAGNOSIS — R972 Elevated prostate specific antigen [PSA]: Secondary | ICD-10-CM

## 2017-09-01 HISTORY — DX: Gastro-esophageal reflux disease without esophagitis: K21.9

## 2017-09-01 HISTORY — DX: Cardiac murmur, unspecified: R01.1

## 2017-09-01 HISTORY — DX: Reserved for concepts with insufficient information to code with codable children: IMO0002

## 2017-09-01 HISTORY — PX: PROSTATE BIOPSY: SHX241

## 2017-09-01 HISTORY — DX: Dependence on renal dialysis: Z99.2

## 2017-09-01 HISTORY — DX: Aneurysm of unspecified site: I72.9

## 2017-09-01 HISTORY — DX: Systemic lupus erythematosus, unspecified: M32.9

## 2017-09-01 HISTORY — DX: Anemia, unspecified: D64.9

## 2017-09-01 HISTORY — DX: Pericardial effusion (noninflammatory): I31.3

## 2017-09-01 HISTORY — DX: Personal history of other diseases of the respiratory system: Z87.09

## 2017-09-01 LAB — POCT I-STAT, CHEM 8
BUN: 31 mg/dL — ABNORMAL HIGH (ref 6–20)
CALCIUM ION: 1.4 mmol/L (ref 1.15–1.40)
Chloride: 107 mmol/L (ref 101–111)
Creatinine, Ser: 1.8 mg/dL — ABNORMAL HIGH (ref 0.61–1.24)
GLUCOSE: 94 mg/dL (ref 65–99)
HCT: 48 % (ref 39.0–52.0)
HEMOGLOBIN: 16.3 g/dL (ref 13.0–17.0)
Potassium: 4.7 mmol/L (ref 3.5–5.1)
Sodium: 143 mmol/L (ref 135–145)
TCO2: 27 mmol/L (ref 22–32)

## 2017-09-01 SURGERY — BIOPSY, PROSTATE, RECTAL APPROACH, WITH US GUIDANCE
Anesthesia: Monitor Anesthesia Care

## 2017-09-01 MED ORDER — MIDAZOLAM HCL 5 MG/5ML IJ SOLN
INTRAMUSCULAR | Status: DC | PRN
  Administered 2017-09-01: 2 mg via INTRAVENOUS

## 2017-09-01 MED ORDER — PROPOFOL 500 MG/50ML IV EMUL
INTRAVENOUS | Status: DC | PRN
  Administered 2017-09-01: 150 ug/kg/min via INTRAVENOUS

## 2017-09-01 MED ORDER — FENTANYL CITRATE (PF) 100 MCG/2ML IJ SOLN
25.0000 ug | INTRAMUSCULAR | Status: DC | PRN
  Filled 2017-09-01: qty 1

## 2017-09-01 MED ORDER — DEXAMETHASONE SODIUM PHOSPHATE 4 MG/ML IJ SOLN
INTRAMUSCULAR | Status: DC | PRN
  Administered 2017-09-01: 10 mg via INTRAVENOUS

## 2017-09-01 MED ORDER — ONDANSETRON HCL 4 MG/2ML IJ SOLN
INTRAMUSCULAR | Status: DC | PRN
  Administered 2017-09-01: 4 mg via INTRAVENOUS

## 2017-09-01 MED ORDER — LEVOFLOXACIN 750 MG PO TABS
750.0000 mg | ORAL_TABLET | Freq: Every day | ORAL | Status: DC
  Administered 2017-09-01: 750 mg via ORAL
  Filled 2017-09-01 (×3): qty 1

## 2017-09-01 MED ORDER — ACETAMINOPHEN 500 MG PO TABS
1000.0000 mg | ORAL_TABLET | Freq: Once | ORAL | Status: AC
  Administered 2017-09-01: 1000 mg via ORAL
  Filled 2017-09-01: qty 2

## 2017-09-01 MED ORDER — ACETAMINOPHEN 500 MG PO TABS
ORAL_TABLET | ORAL | Status: AC
  Filled 2017-09-01: qty 2

## 2017-09-01 MED ORDER — SODIUM CHLORIDE 0.9 % IV SOLN
INTRAVENOUS | Status: DC
  Administered 2017-09-01: 10:00:00 via INTRAVENOUS
  Filled 2017-09-01: qty 1000

## 2017-09-01 MED ORDER — FENTANYL CITRATE (PF) 100 MCG/2ML IJ SOLN
INTRAMUSCULAR | Status: DC | PRN
  Administered 2017-09-01: 50 ug via INTRAVENOUS

## 2017-09-01 MED ORDER — FLEET ENEMA 7-19 GM/118ML RE ENEM
1.0000 | ENEMA | Freq: Once | RECTAL | Status: AC
  Administered 2017-09-01: 1 via RECTAL
  Filled 2017-09-01: qty 1

## 2017-09-01 MED ORDER — LIDOCAINE HCL 2 % IJ SOLN
INTRAMUSCULAR | Status: DC | PRN
  Administered 2017-09-01: 10 mL

## 2017-09-01 MED ORDER — LIDOCAINE 2% (20 MG/ML) 5 ML SYRINGE
INTRAMUSCULAR | Status: DC | PRN
  Administered 2017-09-01: 40 mg via INTRAVENOUS

## 2017-09-01 SURGICAL SUPPLY — 12 items
GLOVE BIO SURGEON STRL SZ8 (GLOVE) IMPLANT
GOWN STRL REUS W/ TWL XL LVL3 (GOWN DISPOSABLE) IMPLANT
GOWN STRL REUS W/TWL XL LVL3 (GOWN DISPOSABLE)
INST BIOPSY MAXCORE 18GX25 (NEEDLE) ×3 IMPLANT
INSTR BIOPSY MAXCORE 18GX20 (NEEDLE) IMPLANT
KIT RM TURNOVER CYSTO AR (KITS) ×3 IMPLANT
NDL SAFETY ECLIPSE 18X1.5 (NEEDLE) IMPLANT
NEEDLE HYPO 18GX1.5 SHARP (NEEDLE)
NEEDLE SPNL 22GX7 QUINCKE BK (NEEDLE) ×3 IMPLANT
SYR CONTROL 10ML LL (SYRINGE) ×3 IMPLANT
TOWEL OR 17X24 6PK STRL BLUE (TOWEL DISPOSABLE) IMPLANT
UNDERPAD 30X30 INCONTINENT (UNDERPADS AND DIAPERS) ×3 IMPLANT

## 2017-09-01 NOTE — Anesthesia Preprocedure Evaluation (Addendum)
Anesthesia Evaluation  Patient identified by MRN, date of birth, ID band Patient awake    Reviewed: Allergy & Precautions, NPO status , Patient's Chart, lab work & pertinent test results  Airway Mallampati: II  TM Distance: >3 FB Neck ROM: Full    Dental   Pulmonary Current Smoker,    breath sounds clear to auscultation       Cardiovascular hypertension, Pt. on medications (-) angina+ CAD  (-) DOE  Rhythm:Regular Rate:Normal  Moderate AS on 2016 Echo. Normal EF. No symptoms.    Neuro/Psych negative neurological ROS     GI/Hepatic Neg liver ROS, GERD  Medicated,  Endo/Other  negative endocrine ROS  Renal/GU Renal disease (s/p kidney transplant)     Musculoskeletal   Abdominal   Peds  Hematology negative hematology ROS (+)   Anesthesia Other Findings   Reproductive/Obstetrics                            Anesthesia Physical Anesthesia Plan  ASA: III  Anesthesia Plan: MAC   Post-op Pain Management:    Induction: Intravenous  PONV Risk Score and Plan: 1 and Ondansetron, Propofol infusion and Treatment may vary due to age or medical condition  Airway Management Planned: Natural Airway and Simple Face Mask  Additional Equipment:   Intra-op Plan:   Post-operative Plan:   Informed Consent: I have reviewed the patients History and Physical, chart, labs and discussed the procedure including the risks, benefits and alternatives for the proposed anesthesia with the patient or authorized representative who has indicated his/her understanding and acceptance.     Plan Discussed with: CRNA  Anesthesia Plan Comments:         Anesthesia Quick Evaluation

## 2017-09-01 NOTE — Interval H&P Note (Signed)
History and Physical Interval Note:  09/01/2017 9:32 AM  Joel [name]  has presented today for surgery, with the diagnosis of ELEVATED PROSTATIC SPECIFIC ANTIGEN, PROSTATE NODULE  The various methods of treatment have been discussed with the patient and family. After consideration of risks, benefits and other options for treatment, the patient has consented to  Procedure(s): BIOPSY TRANSRECTAL ULTRASONIC PROSTATE (TUBP) (N/A) as a surgical intervention .  The patient's history has been reviewed, patient examined, no change in status, stable for surgery.  I have reviewed the patient's chart and [name].  Questions were answered to the patient's satisfaction.     Lillette Boxer Buna Cuppett

## 2017-09-01 NOTE — Transfer of Care (Signed)
  Last Vitals:  Vitals:   09/01/17 0914 09/01/17 1259  BP: (!) 146/75   Pulse: 70 65  Resp: 16 13  Temp: 37.1 C   SpO2: 96% 99%    Last Pain:  Vitals:   09/01/17 0914  TempSrc: Oral      Patients Stated Pain Goal: 3 (09/01/17 8588)  Immediate Anesthesia Transfer of Care Note  Patient: Joel [name]  Procedure(s) Performed: Procedure(s) (LRB): BIOPSY TRANSRECTAL ULTRASONIC PROSTATE (TUBP) (N/A)  Patient Location: PACU  Anesthesia Type: General  Level of Consciousness: awake, alert  and oriented  Airway & Oxygen Therapy: Patient Spontanous Breathing and Patient connected to nasal cannula oxygen  Post-op Assessment: Report given to PACU RN and Post -op Vital signs reviewed and stable  Post vital signs: Reviewed and stable  Complications: No apparent anesthesia complications

## 2017-09-01 NOTE — Op Note (Signed)
Preoperative diagnosis: Prostate nodule, elevated PSA  Postoperative diagnosis: Same  Principal procedure: Transrectal ultrasound of the prostate, ultrasound directed biopsies of the prostate x12  Surgeon: Kandice Schmelter  Anesthesia: MAC  Complications: None  Specimen: 12 core biopsies  Estimated blood loss: Less than 20 cc  Indications: 62 year old male recently presenting with elevated PSA.  Limited prostate exam due to patient intolerance of the exam revealed a midline prostatic nodule.  Because of the elevated PSA and prostate nodule, I have recommended ultrasound and biopsy of the prostate.  The patient will need anesthetic for this because of his extreme intolerance of even a digital rectal exam.  The process of ultrasound and biopsy of the prostate have been discussed with the patient.  Risks and complications were discussed it well--ncluding but not limited to bleeding from the rectum, bladder and prostate, infection, fever, need for IV antibiotic administration.  The patient understands these and desires to proceed.  Findings: Prostatic volume 28.1 mL.  Scattered calcifications throughout the prostate.  Definitive hypoechoic area coinciding with previously mentioned prostate nodule just to the left of the midline in the mid prostate.  Minimal residual urine volume in the bladder.  Description of procedure: The patient was properly identified in the holding area.  He received Levaquin p.o. earlier this morning.  He was taken to the operating room where he was placed in the left lateral decubitus position.  IV sedation was administered.  The patient's legs were then flexed.  A well-lubricated transrectal ultrasound probe was advanced into the patient's rectum.  Prostate was scanned.  Volume measurement taken.  Identification of hypoechoic area made.  I then infiltrated 10 cc of 1% plain lidocaine just lateral to the seminal vesicle/prostatic junction bilaterally.  I then took 12 cores using the  biopsy gun as follows: Right base lateral, right base medial, right mid lateral, right mid medial, right apex lateral, right apex medial, left base lateral, left base medial, left mid lateral, left mid medial, left apex lateral, left apex medial.  Each of these biopsies was placed in a separate jar.  Procedure was then completed.  The probe was removed.  Pressure held on the anus for 1 minute.  The patient was then placed in the recumbent position and awakened.  He was then taken to the PACU, having tolerated the procedure well.

## 2017-09-01 NOTE — Discharge Instructions (Signed)
1.  Take it easy today  2.  Start usual routine activities tomorrow, including exercise if desired  3.  Expect blood the first  couple of times you have a bowel movement.  There would be blood in the semen for several weeks.  Do not be alarmed if you see a little bit of blood at the beginning of your urinary stream  4.  Dr. Diona Fanti will call you with the biopsy results and further follow-up.  If you experience fever over the next few days, please call the office the    Post Anesthesia Home Care Instructions  Activity: Get plenty of rest for the remainder of the day. A responsible individual must stay with you for 24 hours following the procedure.  For the next 24 hours, DO NOT: -Drive a car -Paediatric nurse -Drink alcoholic beverages -Take any medication unless instructed by your physician -Make any legal decisions or sign important papers.  Meals: Start with liquid foods such as gelatin or soup. Progress to regular foods as tolerated. Avoid greasy, spicy, heavy foods. If nausea and/or vomiting occur, drink only clear liquids until the nausea and/or vomiting subsides. Call your physician if vomiting continues.  Special Instructions/Symptoms: Your throat may feel dry or sore from the anesthesia or the breathing tube placed in your throat during surgery. If this causes discomfort, gargle with warm salt water. The discomfort should disappear within 24 hours.

## 2017-09-01 NOTE — Anesthesia Procedure Notes (Signed)
Procedure Name: MAC Date/Time: 09/01/2017 12:29 PM Performed by: Suzette Battiest, MD Pre-anesthesia Checklist: Patient identified, Timeout performed, Emergency Drugs available, Suction available and Patient being monitored Patient Re-evaluated:Patient Re-evaluated prior to induction Oxygen Delivery Method: Nasal cannula Placement Confirmation: positive ETCO2,  CO2 detector and breath sounds checked- equal and bilateral

## 2017-09-02 ENCOUNTER — Encounter (HOSPITAL_BASED_OUTPATIENT_CLINIC_OR_DEPARTMENT_OTHER): Payer: Self-pay | Admitting: Urology

## 2017-09-02 NOTE — Anesthesia Postprocedure Evaluation (Signed)
Anesthesia Post Note  Patient: Joel [name]  Procedure(s) Performed: BIOPSY TRANSRECTAL ULTRASONIC PROSTATE (TUBP) (N/A )     Patient location during evaluation: PACU Anesthesia Type: MAC Level of consciousness: awake and alert Pain management: pain level controlled Vital Signs Assessment: post-procedure vital signs reviewed and stable Respiratory status: spontaneous breathing, nonlabored ventilation, respiratory function stable and patient connected to nasal cannula oxygen Cardiovascular status: stable and blood pressure returned to baseline Postop Assessment: no apparent nausea or vomiting Anesthetic complications: no    Last Vitals:  Vitals:   09/01/17 1415 09/01/17 1452  BP: (!) 122/96 (!) 149/66  Pulse: (!) 57 62  Resp: 16 16  Temp:  36.7 C  SpO2: 98% 99%    Last Pain:  Vitals:   09/01/17 0914  TempSrc: Oral   Pain Goal: Patients Stated Pain Goal: 3 (09/01/17 0942)               Tiajuana Amass

## 2017-11-23 DIAGNOSIS — R1032 Left lower quadrant pain: Secondary | ICD-10-CM | POA: Diagnosis not present

## 2022-11-12 DIAGNOSIS — Z940 Kidney transplant status: Secondary | ICD-10-CM | POA: Diagnosis not present

## 2022-11-12 DIAGNOSIS — M25512 Pain in left shoulder: Secondary | ICD-10-CM | POA: Diagnosis not present

## 2022-11-12 DIAGNOSIS — M542 Cervicalgia: Secondary | ICD-10-CM | POA: Diagnosis not present

## 2022-12-02 DIAGNOSIS — M5432 Sciatica, left side: Secondary | ICD-10-CM | POA: Diagnosis not present

## 2022-12-16 DIAGNOSIS — Z0001 Encounter for general adult medical examination with abnormal findings: Secondary | ICD-10-CM | POA: Diagnosis not present

## 2023-01-05 DIAGNOSIS — Z0100 Encounter for examination of eyes and vision without abnormal findings: Secondary | ICD-10-CM | POA: Diagnosis not present

## 2023-01-05 DIAGNOSIS — Z9842 Cataract extraction status, left eye: Secondary | ICD-10-CM | POA: Diagnosis not present

## 2023-01-05 DIAGNOSIS — H524 Presbyopia: Secondary | ICD-10-CM | POA: Diagnosis not present

## 2023-01-05 DIAGNOSIS — H52229 Regular astigmatism, unspecified eye: Secondary | ICD-10-CM | POA: Diagnosis not present

## 2023-01-05 DIAGNOSIS — H3530 Unspecified macular degeneration: Secondary | ICD-10-CM | POA: Diagnosis not present

## 2023-03-04 DIAGNOSIS — Z940 Kidney transplant status: Secondary | ICD-10-CM | POA: Diagnosis not present

## 2023-03-17 DIAGNOSIS — D849 Immunodeficiency, unspecified: Secondary | ICD-10-CM | POA: Diagnosis not present

## 2023-03-17 DIAGNOSIS — E872 Acidosis, unspecified: Secondary | ICD-10-CM | POA: Diagnosis not present

## 2023-03-17 DIAGNOSIS — N2581 Secondary hyperparathyroidism of renal origin: Secondary | ICD-10-CM | POA: Diagnosis not present

## 2023-03-17 DIAGNOSIS — N183 Chronic kidney disease, stage 3 unspecified: Secondary | ICD-10-CM | POA: Diagnosis not present

## 2023-03-17 DIAGNOSIS — I129 Hypertensive chronic kidney disease with stage 1 through stage 4 chronic kidney disease, or unspecified chronic kidney disease: Secondary | ICD-10-CM | POA: Diagnosis not present

## 2023-03-17 DIAGNOSIS — E785 Hyperlipidemia, unspecified: Secondary | ICD-10-CM | POA: Diagnosis not present

## 2023-03-17 DIAGNOSIS — D631 Anemia in chronic kidney disease: Secondary | ICD-10-CM | POA: Diagnosis not present

## 2023-03-17 DIAGNOSIS — Z940 Kidney transplant status: Secondary | ICD-10-CM | POA: Diagnosis not present

## 2023-06-16 DIAGNOSIS — D631 Anemia in chronic kidney disease: Secondary | ICD-10-CM | POA: Diagnosis not present

## 2023-06-16 DIAGNOSIS — Z940 Kidney transplant status: Secondary | ICD-10-CM | POA: Diagnosis not present

## 2023-06-16 DIAGNOSIS — N189 Chronic kidney disease, unspecified: Secondary | ICD-10-CM | POA: Diagnosis not present

## 2023-06-16 DIAGNOSIS — E872 Acidosis, unspecified: Secondary | ICD-10-CM | POA: Diagnosis not present

## 2023-06-16 DIAGNOSIS — I129 Hypertensive chronic kidney disease with stage 1 through stage 4 chronic kidney disease, or unspecified chronic kidney disease: Secondary | ICD-10-CM | POA: Diagnosis not present

## 2023-06-16 DIAGNOSIS — N183 Chronic kidney disease, stage 3 unspecified: Secondary | ICD-10-CM | POA: Diagnosis not present

## 2023-06-16 DIAGNOSIS — D849 Immunodeficiency, unspecified: Secondary | ICD-10-CM | POA: Diagnosis not present

## 2023-06-16 DIAGNOSIS — N2581 Secondary hyperparathyroidism of renal origin: Secondary | ICD-10-CM | POA: Diagnosis not present

## 2023-08-26 DIAGNOSIS — M25512 Pain in left shoulder: Secondary | ICD-10-CM | POA: Diagnosis not present

## 2023-08-26 DIAGNOSIS — M542 Cervicalgia: Secondary | ICD-10-CM | POA: Diagnosis not present

## 2023-10-11 DIAGNOSIS — M542 Cervicalgia: Secondary | ICD-10-CM | POA: Diagnosis not present

## 2023-10-11 DIAGNOSIS — M25512 Pain in left shoulder: Secondary | ICD-10-CM | POA: Diagnosis not present

## 2023-10-11 DIAGNOSIS — D849 Immunodeficiency, unspecified: Secondary | ICD-10-CM | POA: Diagnosis not present

## 2023-10-11 DIAGNOSIS — H3530 Unspecified macular degeneration: Secondary | ICD-10-CM | POA: Diagnosis not present

## 2023-10-11 DIAGNOSIS — H3122 Choroidal dystrophy (central areolar) (generalized) (peripapillary): Secondary | ICD-10-CM | POA: Diagnosis not present

## 2023-10-11 DIAGNOSIS — H524 Presbyopia: Secondary | ICD-10-CM | POA: Diagnosis not present

## 2023-10-11 DIAGNOSIS — Z940 Kidney transplant status: Secondary | ICD-10-CM | POA: Diagnosis not present

## 2023-10-11 DIAGNOSIS — H52229 Regular astigmatism, unspecified eye: Secondary | ICD-10-CM | POA: Diagnosis not present

## 2023-10-20 DIAGNOSIS — M5090 Cervical disc disorder, unspecified, unspecified cervical region: Secondary | ICD-10-CM | POA: Diagnosis not present

## 2023-10-20 DIAGNOSIS — M47812 Spondylosis without myelopathy or radiculopathy, cervical region: Secondary | ICD-10-CM | POA: Diagnosis not present

## 2023-10-20 DIAGNOSIS — M25551 Pain in right hip: Secondary | ICD-10-CM | POA: Diagnosis not present

## 2023-10-20 DIAGNOSIS — M5412 Radiculopathy, cervical region: Secondary | ICD-10-CM | POA: Diagnosis not present

## 2023-10-24 DIAGNOSIS — D631 Anemia in chronic kidney disease: Secondary | ICD-10-CM | POA: Diagnosis not present

## 2023-10-24 DIAGNOSIS — N183 Chronic kidney disease, stage 3 unspecified: Secondary | ICD-10-CM | POA: Diagnosis not present

## 2023-10-24 DIAGNOSIS — N2581 Secondary hyperparathyroidism of renal origin: Secondary | ICD-10-CM | POA: Diagnosis not present

## 2023-10-24 DIAGNOSIS — N189 Chronic kidney disease, unspecified: Secondary | ICD-10-CM | POA: Diagnosis not present

## 2023-10-24 DIAGNOSIS — I129 Hypertensive chronic kidney disease with stage 1 through stage 4 chronic kidney disease, or unspecified chronic kidney disease: Secondary | ICD-10-CM | POA: Diagnosis not present

## 2023-10-24 DIAGNOSIS — D849 Immunodeficiency, unspecified: Secondary | ICD-10-CM | POA: Diagnosis not present

## 2023-10-24 DIAGNOSIS — E872 Acidosis, unspecified: Secondary | ICD-10-CM | POA: Diagnosis not present

## 2023-10-24 DIAGNOSIS — Z940 Kidney transplant status: Secondary | ICD-10-CM | POA: Diagnosis not present

## 2023-11-03 DIAGNOSIS — M4722 Other spondylosis with radiculopathy, cervical region: Secondary | ICD-10-CM | POA: Diagnosis not present

## 2023-11-03 DIAGNOSIS — M4802 Spinal stenosis, cervical region: Secondary | ICD-10-CM | POA: Diagnosis not present

## 2023-11-08 DIAGNOSIS — J90 Pleural effusion, not elsewhere classified: Secondary | ICD-10-CM | POA: Diagnosis not present

## 2023-11-08 DIAGNOSIS — M5412 Radiculopathy, cervical region: Secondary | ICD-10-CM | POA: Diagnosis not present

## 2023-11-08 DIAGNOSIS — Z79899 Other long term (current) drug therapy: Secondary | ICD-10-CM | POA: Diagnosis not present

## 2023-11-08 DIAGNOSIS — M79603 Pain in arm, unspecified: Secondary | ICD-10-CM | POA: Diagnosis not present

## 2023-11-08 DIAGNOSIS — Z01818 Encounter for other preprocedural examination: Secondary | ICD-10-CM | POA: Diagnosis not present

## 2023-11-08 DIAGNOSIS — E559 Vitamin D deficiency, unspecified: Secondary | ICD-10-CM | POA: Diagnosis not present

## 2023-11-16 DIAGNOSIS — H26491 Other secondary cataract, right eye: Secondary | ICD-10-CM | POA: Diagnosis not present

## 2023-11-17 DIAGNOSIS — H353211 Exudative age-related macular degeneration, right eye, with active choroidal neovascularization: Secondary | ICD-10-CM | POA: Diagnosis not present

## 2023-11-24 DIAGNOSIS — D631 Anemia in chronic kidney disease: Secondary | ICD-10-CM | POA: Diagnosis not present

## 2023-11-24 DIAGNOSIS — N183 Chronic kidney disease, stage 3 unspecified: Secondary | ICD-10-CM | POA: Diagnosis not present

## 2023-11-24 DIAGNOSIS — I259 Chronic ischemic heart disease, unspecified: Secondary | ICD-10-CM | POA: Diagnosis not present

## 2023-11-24 DIAGNOSIS — E872 Acidosis, unspecified: Secondary | ICD-10-CM | POA: Diagnosis not present

## 2023-11-24 DIAGNOSIS — N2581 Secondary hyperparathyroidism of renal origin: Secondary | ICD-10-CM | POA: Diagnosis not present

## 2023-11-24 DIAGNOSIS — Z720 Tobacco use: Secondary | ICD-10-CM | POA: Diagnosis not present

## 2023-11-24 DIAGNOSIS — Z940 Kidney transplant status: Secondary | ICD-10-CM | POA: Diagnosis not present

## 2023-11-24 DIAGNOSIS — M5412 Radiculopathy, cervical region: Secondary | ICD-10-CM | POA: Diagnosis not present

## 2023-11-24 DIAGNOSIS — D849 Immunodeficiency, unspecified: Secondary | ICD-10-CM | POA: Diagnosis not present

## 2023-11-24 DIAGNOSIS — I129 Hypertensive chronic kidney disease with stage 1 through stage 4 chronic kidney disease, or unspecified chronic kidney disease: Secondary | ICD-10-CM | POA: Diagnosis not present

## 2023-11-24 DIAGNOSIS — N189 Chronic kidney disease, unspecified: Secondary | ICD-10-CM | POA: Diagnosis not present

## 2023-11-29 DIAGNOSIS — I350 Nonrheumatic aortic (valve) stenosis: Secondary | ICD-10-CM | POA: Diagnosis not present

## 2023-11-29 DIAGNOSIS — I2510 Atherosclerotic heart disease of native coronary artery without angina pectoris: Secondary | ICD-10-CM | POA: Diagnosis not present

## 2023-11-29 DIAGNOSIS — T8610 Unspecified complication of kidney transplant: Secondary | ICD-10-CM | POA: Diagnosis not present

## 2023-11-29 DIAGNOSIS — I498 Other specified cardiac arrhythmias: Secondary | ICD-10-CM | POA: Diagnosis not present

## 2023-11-29 DIAGNOSIS — I10 Essential (primary) hypertension: Secondary | ICD-10-CM | POA: Diagnosis not present

## 2023-11-29 DIAGNOSIS — Z7689 Persons encountering health services in other specified circumstances: Secondary | ICD-10-CM | POA: Diagnosis not present

## 2023-11-29 DIAGNOSIS — I440 Atrioventricular block, first degree: Secondary | ICD-10-CM | POA: Diagnosis not present

## 2023-11-29 DIAGNOSIS — I2109 ST elevation (STEMI) myocardial infarction involving other coronary artery of anterior wall: Secondary | ICD-10-CM | POA: Diagnosis not present

## 2023-11-29 NOTE — Progress Notes (Signed)
 Outpatient Cardiology Clinic   Cardiology New Patient Consultation  Consulting Cardiologist:  [name], MD, [facility] Referring Physician:  Carlon     Reason for Visit/Chief Complaint:  Chief Complaint  Patient presents with  . New Patient    History of Present Illness, Interval History, Medication, and PMH Review:   HPI:  Joel [name]. is a friendly 68 y.o. male who previously worked as a Clinical biochemist and in his free time enjoys fishing.  He presents now to be evaluated prior to elective cervical discectomy.   Patient was seen by referring Dr. Mitzie Carlon who noted some concerning findings with the baseline ECG.  Patient is known to have 9-year history of hemodialysis and underwent renal transplant on March 2017.  He also has stable coronary artery disease with PCI to RCA in 2013.  Other conditions include moderate aortic stenosis with the last echo of Dec 08, 2018 showing EF 60% to 65% with mild left atrial enlargement and diffuse aortic valve calcification with moderate AS showing mean pressure of 16 mmHg.  There was moderate mitral annular calcification.  Patient currently is doing well with no physical limitations and he can do all activities of daily living without difficulty.  Patient claims that he can walk 1 mile without much difficulty.  He denies having chest pain, shortness of breath, palpitations, dizziness or syncope.  Today Nov 29, 2023 blood pressure is 140/80 mmHg heart rate of 87 bpm. ECG today is 2025 shows sinus rhythm with first-degree AV block of 250 ms heart rate of 83 bpm.  There was LVH noted by voltage with late R wave transitions unchanged from previous studies.  Asymmetric T wave inversions were noted in leads V5 and V6.  Late R wave transition was noted and is an old finding.  Past Medical History: End-stage renal disease for 9 years and status post renal transplant March 2017.  PCI to RCA 2013.  Hyperparathyroidism, chronic lower back  pain, penile implant October 01, 2016  Past Medical History:  Diagnosis Date  . A-V fistula 2010  . Anuria 07/30/2015  . Arrhythmia   . Arteriovenous fistula of transplanted kidney (CMD) 10/06/2015  . Blurred vision   . CKD (chronic kidney disease) stage 3, GFR 30-59 ml/min (CMD) 11/17/2015  . Coronary artery disease involving native coronary artery of native heart without angina pectoris 2013  . DDD (degenerative disc disease), lumbar   . Delayed graft function of kidney (CMD) 10/08/2015  . 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 Erectile dysfunction   . ESRD (end stage renal disease)    (CMD) 12/2007   transplanted in 2017 and functioning as of 01/2019, last documented GFR 37 Scr 2.14 & K+ 4.5 on 09/11/2018  . Essential hypertension 07/30/2015  . Exudative age-related macular degeneration of both eyes with active choroidal neovascularization    (CMD) 09/02/2015  . Full dentures   . Gastro-esophageal reflux disease without esophagitis 10/23/2015  . H/O joint problems   . H/O tobacco use, presenting hazards to health 07/30/2015  . Hypertension   . Insomnia   . Malignant neoplasm of prostate    (CMD) 11/03/2017  . Nuclear sclerotic cataract of both eyes 09/02/2015  . OSA (obstructive sleep apnea)   . Pancreatitis (CMD) 05/15/2014  . Pericardial effusion without cardiac tamponade (CMD) 08/18/2011  . Pleural effusion on left 05/27/2014  . Poor appetite   . Pre-transplant evaluation for end stage renal disease 07/30/2015  . Pseudoaneurysm of AV hemodialysis  fistula 2012   left upper arm; Dr. [name] Haddock [facility]  . Pseudophakia of right eye 12/12/2018   AMO Tecnis PCB00 22.0 diopters  . Reduced libido 02/04/2015    Past Surgical History:  Procedure Laterality Date  . AV FISTULA PLACEMENT Left    Procedure: AV FISTULA PLACEMENT; upper arm and pseudoaneurysm repaired with AVG material; [facility]  . AV FISTULA PLACEMENT Right    Procedure: AV FISTULA PLACEMENT; upper arm; [facility]  . CATARACT EXTRACTION W/  INTRAOCULAR LENS  IMPLANT Right 12/12/2018   Procedure: PHACOEMULSIFICATION PC / IOL TOPICAL;  Surgeon: Donnice Sickles, MD;  Location: [facility] MAIN OR;  Service: Ophthalmology;  Laterality: Right;  . CATARACT EXTRACTION W/  INTRAOCULAR LENS IMPLANT Left 02/01/2019   Procedure: PHACOEMULSIFICATION PC / IOL TOPICAL;  Surgeon: Donnice Sickles, MD;  Location: [facility] MAIN OR;  Service: Ophthalmology;  Laterality: Left;  . CORONARY ANGIOPLASTY WITH STENT PLACEMENT  08/18/2011   Procedure: CORONARY ANGIOPLASTY WITH STENT PLACEMENT; BMS to RCA; [facility]  . KIDNEY TRANSPLANT Left 09/28/2015   Procedure: KIDNEY TRANSPLANT CADAVERIC;  Surgeon: Lamar Fairy Plain, MD;  Location: [facility] MAIN OR;  Service: Transplant;  Laterality: Left;  UNOS# [id number]  bench at 9a patient in the room at 9:30a kidney coming on pump  . PENILE PROSTHESIS IMPLANT N/A 10/11/2016   Procedure: PENILE PROSTHESIS INSERTION;  Surgeon: Bernardino Belvie Gathers, MD;  Location: [facility] MAIN OR;  Service: Urology;  Laterality: N/A;  . PROSTATE BIOPSY  09/01/2017   Procedure: PROSTATE BIOPSY  . PROSTATE BIOPSY N/A 12/14/2018   Procedure: TRANSPERINEAL PROSTATE BIOPSY;  Surgeon: Tanda Jama Nicholaus DOUGLAS, MD;  Location: [facility];  Service: Urology;  Laterality: N/A;    Review of Systems:   Review of Systems  Constitutional: Negative for fever and weight loss.  HENT:  Negative for nosebleeds.   Eyes:  Negative for visual disturbance.  Cardiovascular:  Negative for chest pain, claudication, dyspnea on exertion, leg swelling, palpitations and syncope.  Respiratory:  Negative for cough, shortness of breath and wheezing.   Endocrine: Negative for polyuria.  Hematologic/Lymphatic: Negative for adenopathy.  Skin:  Negative for rash.  Musculoskeletal:  Negative for joint pain and muscle weakness.  Gastrointestinal:  Negative for abdominal pain, constipation, hematemesis and nausea.  Neurological:  Negative for focal weakness and paresthesias.  Psychiatric/Behavioral:   Negative for depression.     Current Medications and Allergies:  Current Outpatient Medications  Medication Sig Dispense Refill  . amLODIPine (NORVASC) 2.5 mg tablet Take 2.5 mg by mouth Once Daily. 30 tablet 11  . amLODIPine (NORVASC) 2.5 mg tablet 1 tab by mouth daily 30 tablet 5  . amLODIPine (NORVASC) 5 mg tablet Take 5 mg by mouth Once Daily. 60 tablet 3  . aspirin  81 mg EC tablet 81 mg Once Daily.    SABRA atorvastatin (LIPITOR) 20 mg tablet Take 20 mg by mouth nightly. 90 tablet 1  . atorvastatin (LIPITOR) 20 mg tablet 1 tab by mouth daily 30 tablet 5  . gabapentin (NEURONTIN) 300 mg capsule  30 capsule 3  . meloxicam (MOBIC) 15 mg tablet Take one tablet once daily as needed for neck pain. 90 tablet 4  . multivitamin, adult, (Centravites) 0.4-162-18 mg tab Take  by mouth.    . mycophenolate (MYFORTIC) 180 mg TbEC DR tablet TAKE 2 TABLETS BY MOUTH EVERY MORNING AND 1 TABLET BY MOUTH EVERY EVENING 90 tablet 5  . mycophenolate (MYFORTIC) 180 mg TbEC DR tablet Take  by mouth. 90 tablet 12  .  mycophenolate (MYFORTIC) 180 mg TbEC DR tablet Take  by mouth. 90 tablet 12  . mycophenolate (MYFORTIC) 180 mg TbEC DR tablet 2 (two) Tablet in the am and 1 in the pm 90 tablet 5  . omeprazole (PriLOSEC) 20 mg DR capsule Take 20 mg by mouth Once Daily.    SABRA omeprazole (PriLOSEC) 20 mg DR capsule Take  by mouth. 30 capsule 6  . predniSONE  (DELTASONE ) 5 mg tablet Take 5 mg by mouth Once Daily. 30 tablet 5  . predniSONE  (DELTASONE ) 5 mg tablet Take 5 mg by mouth Once Daily. 30 tablet 11  . predniSONE  (DELTASONE ) 5 mg tablet Take 5 mg by mouth Once Daily. 30 tablet 11  . predniSONE  (DELTASONE ) 5 mg tablet 1 tab by mouth daily 30 tablet 5  . sodium bicarbonate 650 mg tablet TAKE 1 TABLET BY MOUTH DAILY. 30 tablet 5  . sodium bicarbonate 650 mg tablet Take 1 tablet by mouth twice daily. 60 tablet 11  . sodium bicarbonate 650 mg tablet Take 650 mg by mouth Once Daily. 60 tablet 11  . sodium bicarbonate 650 mg  tablet Take 2 (two) Tablet by mouth two times daily 120 tablet 5  . tacrolimus  (PROGRAF ) 1 mg capsule Take 2 mg in am and 1 mg in pm 90 capsule 5  . tacrolimus  (PROGRAF ) 1 mg capsule Take 2 capsules by mouth in the morning and 1 capsule in the evening. Max daily dose: 3 capsules. 90 capsule 11  . tacrolimus  (Prograf ) 1 mg capsule 2 (two) Capsule in the am and 1 in the pm, max daily dose: 3 Capsule 90 capsule 5  . tiZANidine (ZANAFLEX) 4 mg tablet Take one tablet every 8 hours as needed for muscle spasms/pain in neck 60 tablet 1  . amLODIPine (NORVASC) 2.5 mg tablet TAKE 1 TABLET BY MOUTH DAILY 30 tablet 11  . atorvastatin (LIPITOR) 20 mg tablet TAKE 1 TABLET BY MOUTH DAILY 30 tablet 11  . atorvastatin (LIPITOR) 20 mg tablet TAKE 1 TABLET BY MOUTH DAILY 30 tablet 11  . mycophenolate (MYFORTIC) 180 mg TbEC DR tablet TAKE 2 TABLETS BY MOUTH IN THE MORNING AND 1 TABLET BY MOUTH IN THE EVENING 90 tablet 12  . Prograf  1 mg capsule TAKE 2 CAPSULES BY MOUTH EVERY MORNING AND 1 CAPSULE BY MOUTH EVERY EVENING 90 capsule 11  . sodium bicarbonate 650 mg tablet TAKE 1 TABLET BY MOUTH DAILY 30 tablet 12  . sodium bicarbonate 650 mg tablet Take 1 (one) Tablet by mouth three times daily 90 tablet 5  . sodium bicarbonate 650 mg tablet Take 1 (one) Tablet by mouth three times daily 90 tablet 11  . tacrolimus  (PROGRAF ) 1 mg capsule take 2 capsules by mouth in the am and take 1 capsule in the pm 90 capsule 11   No current facility-administered medications for this visit.   No Known Allergies  Vital Signs and Physical Examination:   Vitals:    Vitals:   11/29/23 0909  BP: 140/80  Pulse: 87  SpO2: 98%  Weight: 97.3 kg (214 lb 6.4 oz)  Height: 1.727 m (5' 8)    BP Readings from Last 10 Encounters:  11/29/23 140/80  08/03/22 145/69  05/11/22 130/79  01/05/22 (!) 161/74  10/06/21 (!) 164/84  07/14/21 153/79  03/03/21 138/71  11/25/20 144/78  09/02/20 (!) 163/77  06/10/20 143/79    Pulse Readings  from Last 3 Encounters:  11/29/23 87  08/03/22 84  05/11/22 90    Wt  Readings from Last 10 Encounters:  11/29/23 97.3 kg (214 lb 6.4 oz)  11/13/19 97.1 kg (214 lb 1.1 oz)  10/30/19 97.7 kg (215 lb 6.2 oz)    Body mass index is 32.6 kg/m.   Constitutional:      Appearance: Healthy appearance.  Eyes:     Pupils: Pupils are equal, round, and reactive to light.  Neck:     Vascular: JVD normal.     Lymphadenopathy: No cervical adenopathy.  Pulmonary:     Breath sounds: Normal breath sounds. No wheezing.  Chest:     Chest wall: Not tender to palpatation.  Cardiovascular:     PMI at left midclavicular line. Normal rate. Regular rhythm. Normal S1. Normal S2.      Murmurs: There is no murmur.     No gallop.  No click. No rub.  Pulses:    Intact distal pulses.  Edema:    Peripheral edema absent.  Abdominal:     General: Bowel sounds are normal. There is no distension.  Musculoskeletal:        General: No tenderness.     Comments: Neck and left arm discomfort Skin:    General: Skin is warm.     Coloration: Skin is not jaundiced.     Findings: No rash.  Neurological:     Mental Status: Alert and oriented to person, place and time. Exhibits a cognitive deficit.     Gait: Gait is intact.     Diagnostic Data and Study Review:   12-lead ECG: personally reviewed: Study of Nov 29, 2023 shows sinus rhythm with first-degree AV block of 250 ms.  There was LVH with late R wave transition and asymmetrical T waves in leads V5 and V6  Most recent echocardiogram: Prior echo of Dec 08, 2018 showed preserved ejection fraction of 60% to 65% with mild left atrial enlargement and diffuse aortic valve calcification with moderate aortic stenosis and mean gradient of 16 mmHg.  There was mild to moderate aortic regurgitation with moderate mitral annular calcification  Labs: Lab Results  Component Value Date   WBC 4.9 03/10/2021   HGB 14.9 03/10/2021   HCT 47.1 03/10/2021   MCV 87.5 03/10/2021    PLT 171 03/10/2021    Lab Results  Component Value Date   CREATININE 1.99 (H) 03/10/2021   BUN 21 03/10/2021   NA 138 03/10/2021   K 4.5 03/10/2021   CL 106 03/10/2021   CO2 25 03/10/2021    Lab Results  Component Value Date   HGBA1C 5.5 10/30/2019    Lab Results  Component Value Date   CHOL 195 10/30/2019   TRIG 87 10/30/2019   HDL 76 10/30/2019    No results found for: LDLCALC   No results found for: TSH  HX MAGNESIUM  Date Value Ref Range Status  10/30/2019 1.9 1.8 - 2.4 MG/DL Final    Comment:    Patients taking eltrombopag at doses >/= 100 mg daily may show falsely elevated values of 10% or greater.     Assessment and Plan:   Patient is a friendly 68 year old gentleman who is asymptomatic and can do activities of daily living without difficulty.  He is known by prior echocardiogram done 5 years ago May 2020 to have preserved EF of 65% with moderate aortic stenosis which from gradient mean of 16 mmHg was more consistent with mild AS.  Nevertheless he is anticipating elective discectomy for cervical neck issue and will require clearance.  After reviewing  his echocardiogram if no severe AS is identified he may otherwise be cleared to go forward with surgery.  Today Nov 29, 2023 blood pressure is 140/80 mmHg with heart rate of 87 bpm.   I recommend that the patient follow-up with cardiology services in 3 months.   Health Maintenance issues including appropriate healthy diet, exercise and smoking avoidance were discussed with patient. Risks, benefits, and alternatives of the medications and treatment plan prescribed today were discussed, and patient expressed understanding. Plan follow-up as discussed or as needed if any worsening symptoms or change in condition.  I have personally spent 40 minutes involved in face-to-face and non-face-to-face activities for this patient on the day of the visit.  Professional time spent includes the following activities, in addition to  those noted in the documentation.   Electronically signed by: [name], MD 11/29/2023 9:40 AM

## 2023-12-01 NOTE — Telephone Encounter (Signed)
 Patient scheduled for ECHO on 12/05/2023. Provider will make recommendations regarding surgery after testing is complete. LM on Sharon Hogan's VM with this information as well. KSBohlen, RN

## 2023-12-05 DIAGNOSIS — I080 Rheumatic disorders of both mitral and aortic valves: Secondary | ICD-10-CM | POA: Diagnosis not present

## 2023-12-20 DIAGNOSIS — Z0000 Encounter for general adult medical examination without abnormal findings: Secondary | ICD-10-CM | POA: Diagnosis not present

## 2023-12-20 DIAGNOSIS — Z9181 History of falling: Secondary | ICD-10-CM | POA: Diagnosis not present

## 2023-12-20 DIAGNOSIS — I2510 Atherosclerotic heart disease of native coronary artery without angina pectoris: Secondary | ICD-10-CM | POA: Diagnosis not present

## 2023-12-20 DIAGNOSIS — I10 Essential (primary) hypertension: Secondary | ICD-10-CM | POA: Diagnosis not present

## 2023-12-20 DIAGNOSIS — E785 Hyperlipidemia, unspecified: Secondary | ICD-10-CM | POA: Diagnosis not present

## 2023-12-20 DIAGNOSIS — Z940 Kidney transplant status: Secondary | ICD-10-CM | POA: Diagnosis not present

## 2023-12-20 DIAGNOSIS — M545 Low back pain, unspecified: Secondary | ICD-10-CM | POA: Diagnosis not present

## 2023-12-26 DIAGNOSIS — M4802 Spinal stenosis, cervical region: Secondary | ICD-10-CM | POA: Diagnosis not present

## 2023-12-26 DIAGNOSIS — I10 Essential (primary) hypertension: Secondary | ICD-10-CM | POA: Diagnosis not present

## 2023-12-26 DIAGNOSIS — E7800 Pure hypercholesterolemia, unspecified: Secondary | ICD-10-CM | POA: Diagnosis not present

## 2023-12-26 DIAGNOSIS — N186 End stage renal disease: Secondary | ICD-10-CM | POA: Diagnosis not present

## 2023-12-26 DIAGNOSIS — I2510 Atherosclerotic heart disease of native coronary artery without angina pectoris: Secondary | ICD-10-CM | POA: Diagnosis not present

## 2023-12-26 DIAGNOSIS — M4722 Other spondylosis with radiculopathy, cervical region: Secondary | ICD-10-CM | POA: Diagnosis not present

## 2023-12-26 DIAGNOSIS — Z940 Kidney transplant status: Secondary | ICD-10-CM | POA: Diagnosis not present

## 2023-12-26 DIAGNOSIS — I509 Heart failure, unspecified: Secondary | ICD-10-CM | POA: Diagnosis not present

## 2023-12-26 DIAGNOSIS — F17210 Nicotine dependence, cigarettes, uncomplicated: Secondary | ICD-10-CM | POA: Diagnosis not present

## 2023-12-26 DIAGNOSIS — I252 Old myocardial infarction: Secondary | ICD-10-CM | POA: Diagnosis not present

## 2023-12-26 DIAGNOSIS — Z79899 Other long term (current) drug therapy: Secondary | ICD-10-CM | POA: Diagnosis not present

## 2023-12-26 DIAGNOSIS — Z7982 Long term (current) use of aspirin: Secondary | ICD-10-CM | POA: Diagnosis not present

## 2023-12-26 DIAGNOSIS — I132 Hypertensive heart and chronic kidney disease with heart failure and with stage 5 chronic kidney disease, or end stage renal disease: Secondary | ICD-10-CM | POA: Diagnosis not present

## 2023-12-26 DIAGNOSIS — Z955 Presence of coronary angioplasty implant and graft: Secondary | ICD-10-CM | POA: Diagnosis not present

## 2023-12-26 DIAGNOSIS — M5011 Cervical disc disorder with radiculopathy,  high cervical region: Secondary | ICD-10-CM | POA: Diagnosis not present

## 2023-12-26 DIAGNOSIS — I350 Nonrheumatic aortic (valve) stenosis: Secondary | ICD-10-CM | POA: Diagnosis not present

## 2023-12-26 DIAGNOSIS — Z992 Dependence on renal dialysis: Secondary | ICD-10-CM | POA: Diagnosis not present

## 2023-12-26 DIAGNOSIS — Z7952 Long term (current) use of systemic steroids: Secondary | ICD-10-CM | POA: Diagnosis not present

## 2023-12-26 DIAGNOSIS — K219 Gastro-esophageal reflux disease without esophagitis: Secondary | ICD-10-CM | POA: Diagnosis not present

## 2023-12-26 DIAGNOSIS — M50121 Cervical disc disorder at C4-C5 level with radiculopathy: Secondary | ICD-10-CM | POA: Diagnosis not present

## 2023-12-26 DIAGNOSIS — Z981 Arthrodesis status: Secondary | ICD-10-CM | POA: Diagnosis not present

## 2023-12-26 DIAGNOSIS — H3530 Unspecified macular degeneration: Secondary | ICD-10-CM | POA: Diagnosis not present

## 2023-12-26 DIAGNOSIS — M5412 Radiculopathy, cervical region: Secondary | ICD-10-CM | POA: Diagnosis not present

## 2023-12-27 DIAGNOSIS — I2510 Atherosclerotic heart disease of native coronary artery without angina pectoris: Secondary | ICD-10-CM | POA: Diagnosis not present

## 2023-12-27 DIAGNOSIS — I509 Heart failure, unspecified: Secondary | ICD-10-CM | POA: Diagnosis not present

## 2023-12-27 DIAGNOSIS — M5011 Cervical disc disorder with radiculopathy,  high cervical region: Secondary | ICD-10-CM | POA: Diagnosis not present

## 2023-12-27 DIAGNOSIS — I132 Hypertensive heart and chronic kidney disease with heart failure and with stage 5 chronic kidney disease, or end stage renal disease: Secondary | ICD-10-CM | POA: Diagnosis not present

## 2023-12-27 DIAGNOSIS — E7800 Pure hypercholesterolemia, unspecified: Secondary | ICD-10-CM | POA: Diagnosis not present

## 2023-12-27 DIAGNOSIS — Z7952 Long term (current) use of systemic steroids: Secondary | ICD-10-CM | POA: Diagnosis not present

## 2023-12-27 DIAGNOSIS — Z7982 Long term (current) use of aspirin: Secondary | ICD-10-CM | POA: Diagnosis not present

## 2023-12-27 DIAGNOSIS — M4722 Other spondylosis with radiculopathy, cervical region: Secondary | ICD-10-CM | POA: Diagnosis not present

## 2023-12-27 DIAGNOSIS — Z940 Kidney transplant status: Secondary | ICD-10-CM | POA: Diagnosis not present

## 2023-12-27 DIAGNOSIS — Z955 Presence of coronary angioplasty implant and graft: Secondary | ICD-10-CM | POA: Diagnosis not present

## 2023-12-27 DIAGNOSIS — I350 Nonrheumatic aortic (valve) stenosis: Secondary | ICD-10-CM | POA: Diagnosis not present

## 2023-12-27 DIAGNOSIS — F17210 Nicotine dependence, cigarettes, uncomplicated: Secondary | ICD-10-CM | POA: Diagnosis not present

## 2023-12-27 DIAGNOSIS — H3530 Unspecified macular degeneration: Secondary | ICD-10-CM | POA: Diagnosis not present

## 2023-12-27 DIAGNOSIS — Z992 Dependence on renal dialysis: Secondary | ICD-10-CM | POA: Diagnosis not present

## 2023-12-27 DIAGNOSIS — I252 Old myocardial infarction: Secondary | ICD-10-CM | POA: Diagnosis not present

## 2023-12-27 DIAGNOSIS — N186 End stage renal disease: Secondary | ICD-10-CM | POA: Diagnosis not present

## 2023-12-27 DIAGNOSIS — M50121 Cervical disc disorder at C4-C5 level with radiculopathy: Secondary | ICD-10-CM | POA: Diagnosis not present

## 2023-12-27 DIAGNOSIS — Z79899 Other long term (current) drug therapy: Secondary | ICD-10-CM | POA: Diagnosis not present

## 2023-12-27 DIAGNOSIS — K219 Gastro-esophageal reflux disease without esophagitis: Secondary | ICD-10-CM | POA: Diagnosis not present

## 2024-01-03 DIAGNOSIS — M545 Low back pain, unspecified: Secondary | ICD-10-CM | POA: Diagnosis not present

## 2024-01-03 DIAGNOSIS — I10 Essential (primary) hypertension: Secondary | ICD-10-CM | POA: Diagnosis not present

## 2024-01-03 DIAGNOSIS — Z940 Kidney transplant status: Secondary | ICD-10-CM | POA: Diagnosis not present

## 2024-01-03 DIAGNOSIS — E785 Hyperlipidemia, unspecified: Secondary | ICD-10-CM | POA: Diagnosis not present

## 2024-01-03 DIAGNOSIS — I2510 Atherosclerotic heart disease of native coronary artery without angina pectoris: Secondary | ICD-10-CM | POA: Diagnosis not present

## 2024-01-09 DIAGNOSIS — Z940 Kidney transplant status: Secondary | ICD-10-CM | POA: Diagnosis not present

## 2024-01-09 DIAGNOSIS — E872 Acidosis, unspecified: Secondary | ICD-10-CM | POA: Diagnosis not present

## 2024-01-10 DIAGNOSIS — N189 Chronic kidney disease, unspecified: Secondary | ICD-10-CM | POA: Diagnosis not present

## 2024-01-10 DIAGNOSIS — I129 Hypertensive chronic kidney disease with stage 1 through stage 4 chronic kidney disease, or unspecified chronic kidney disease: Secondary | ICD-10-CM | POA: Diagnosis not present

## 2024-01-10 DIAGNOSIS — Z940 Kidney transplant status: Secondary | ICD-10-CM | POA: Diagnosis not present

## 2024-01-17 DIAGNOSIS — I10 Essential (primary) hypertension: Secondary | ICD-10-CM | POA: Diagnosis not present

## 2024-01-17 DIAGNOSIS — E785 Hyperlipidemia, unspecified: Secondary | ICD-10-CM | POA: Diagnosis not present

## 2024-01-17 DIAGNOSIS — I2510 Atherosclerotic heart disease of native coronary artery without angina pectoris: Secondary | ICD-10-CM | POA: Diagnosis not present

## 2024-01-17 DIAGNOSIS — Z940 Kidney transplant status: Secondary | ICD-10-CM | POA: Diagnosis not present

## 2024-01-17 DIAGNOSIS — M545 Low back pain, unspecified: Secondary | ICD-10-CM | POA: Diagnosis not present

## 2024-02-09 DIAGNOSIS — Z940 Kidney transplant status: Secondary | ICD-10-CM | POA: Diagnosis not present

## 2024-02-09 DIAGNOSIS — I10 Essential (primary) hypertension: Secondary | ICD-10-CM | POA: Diagnosis not present

## 2024-02-09 DIAGNOSIS — E785 Hyperlipidemia, unspecified: Secondary | ICD-10-CM | POA: Diagnosis not present

## 2024-02-09 DIAGNOSIS — M545 Low back pain, unspecified: Secondary | ICD-10-CM | POA: Diagnosis not present

## 2024-02-09 DIAGNOSIS — I2510 Atherosclerotic heart disease of native coronary artery without angina pectoris: Secondary | ICD-10-CM | POA: Diagnosis not present

## 2024-02-21 DIAGNOSIS — M5412 Radiculopathy, cervical region: Secondary | ICD-10-CM | POA: Diagnosis not present

## 2024-03-01 DIAGNOSIS — I10 Essential (primary) hypertension: Secondary | ICD-10-CM | POA: Diagnosis not present

## 2024-03-01 DIAGNOSIS — M545 Low back pain, unspecified: Secondary | ICD-10-CM | POA: Diagnosis not present

## 2024-03-01 DIAGNOSIS — E785 Hyperlipidemia, unspecified: Secondary | ICD-10-CM | POA: Diagnosis not present

## 2024-03-01 DIAGNOSIS — I2510 Atherosclerotic heart disease of native coronary artery without angina pectoris: Secondary | ICD-10-CM | POA: Diagnosis not present

## 2024-03-01 DIAGNOSIS — Z940 Kidney transplant status: Secondary | ICD-10-CM | POA: Diagnosis not present

## 2024-03-16 DIAGNOSIS — I2510 Atherosclerotic heart disease of native coronary artery without angina pectoris: Secondary | ICD-10-CM | POA: Diagnosis not present

## 2024-03-16 DIAGNOSIS — M545 Low back pain, unspecified: Secondary | ICD-10-CM | POA: Diagnosis not present

## 2024-03-16 DIAGNOSIS — Z940 Kidney transplant status: Secondary | ICD-10-CM | POA: Diagnosis not present

## 2024-03-16 DIAGNOSIS — E785 Hyperlipidemia, unspecified: Secondary | ICD-10-CM | POA: Diagnosis not present

## 2024-03-16 DIAGNOSIS — I10 Essential (primary) hypertension: Secondary | ICD-10-CM | POA: Diagnosis not present

## 2024-05-03 DIAGNOSIS — D849 Immunodeficiency, unspecified: Secondary | ICD-10-CM | POA: Diagnosis not present

## 2024-05-03 DIAGNOSIS — E785 Hyperlipidemia, unspecified: Secondary | ICD-10-CM | POA: Diagnosis not present

## 2024-05-03 DIAGNOSIS — E872 Acidosis, unspecified: Secondary | ICD-10-CM | POA: Diagnosis not present

## 2024-05-03 DIAGNOSIS — N1832 Chronic kidney disease, stage 3b: Secondary | ICD-10-CM | POA: Diagnosis not present

## 2024-05-03 DIAGNOSIS — Z940 Kidney transplant status: Secondary | ICD-10-CM | POA: Diagnosis not present

## 2024-05-03 DIAGNOSIS — D631 Anemia in chronic kidney disease: Secondary | ICD-10-CM | POA: Diagnosis not present

## 2024-05-03 DIAGNOSIS — I129 Hypertensive chronic kidney disease with stage 1 through stage 4 chronic kidney disease, or unspecified chronic kidney disease: Secondary | ICD-10-CM | POA: Diagnosis not present

## 2024-05-03 DIAGNOSIS — N189 Chronic kidney disease, unspecified: Secondary | ICD-10-CM | POA: Diagnosis not present

## 2024-05-03 DIAGNOSIS — N2581 Secondary hyperparathyroidism of renal origin: Secondary | ICD-10-CM | POA: Diagnosis not present
